# Patient Record
Sex: Female | Born: 1935 | Race: White | Hispanic: No | Marital: Married | Smoking: Never smoker
Health system: Southern US, Community
[De-identification: ages and names within clinical notes are randomized; demographics above are authoritative.]

## PROBLEM LIST (undated history)

## (undated) DIAGNOSIS — M810 Age-related osteoporosis without current pathological fracture: Secondary | ICD-10-CM

## (undated) DIAGNOSIS — S2220XA Unspecified fracture of sternum, initial encounter for closed fracture: Secondary | ICD-10-CM

## (undated) DIAGNOSIS — Z8781 Personal history of (healed) traumatic fracture: Secondary | ICD-10-CM

## (undated) DIAGNOSIS — I10 Essential (primary) hypertension: Secondary | ICD-10-CM

## (undated) DIAGNOSIS — S2239XA Fracture of one rib, unspecified side, initial encounter for closed fracture: Secondary | ICD-10-CM

## (undated) DIAGNOSIS — I2699 Other pulmonary embolism without acute cor pulmonale: Secondary | ICD-10-CM

## (undated) DIAGNOSIS — I639 Cerebral infarction, unspecified: Secondary | ICD-10-CM

## (undated) DIAGNOSIS — Z950 Presence of cardiac pacemaker: Secondary | ICD-10-CM

## (undated) DIAGNOSIS — F0390 Unspecified dementia without behavioral disturbance: Secondary | ICD-10-CM

## (undated) DIAGNOSIS — K529 Noninfective gastroenteritis and colitis, unspecified: Secondary | ICD-10-CM

## (undated) HISTORY — DX: Age-related osteoporosis without current pathological fracture: M81.0

## (undated) HISTORY — PX: APPENDECTOMY: SHX54

## (undated) HISTORY — DX: Unspecified dementia, unspecified severity, without behavioral disturbance, psychotic disturbance, mood disturbance, and anxiety: F03.90

## (undated) HISTORY — PX: ABDOMINAL HYSTERECTOMY: SHX81

## (undated) HISTORY — DX: Presence of cardiac pacemaker: Z95.0

## (undated) HISTORY — DX: Essential (primary) hypertension: I10

## (undated) HISTORY — DX: Noninfective gastroenteritis and colitis, unspecified: K52.9

## (undated) HISTORY — PX: PACEMAKER IMPLANT: EP1218

## (undated) HISTORY — DX: Cerebral infarction, unspecified: I63.9

---

## 2015-02-08 ENCOUNTER — Non-Acute Institutional Stay (SKILLED_NURSING_FACILITY): Payer: Medicare Other | Admitting: Internal Medicine

## 2015-02-08 DIAGNOSIS — R5381 Other malaise: Secondary | ICD-10-CM | POA: Diagnosis not present

## 2015-02-08 DIAGNOSIS — F0391 Unspecified dementia with behavioral disturbance: Secondary | ICD-10-CM | POA: Diagnosis not present

## 2015-02-08 DIAGNOSIS — I10 Essential (primary) hypertension: Secondary | ICD-10-CM | POA: Insufficient documentation

## 2015-02-08 DIAGNOSIS — S2241XS Multiple fractures of ribs, right side, sequela: Secondary | ICD-10-CM

## 2015-02-08 DIAGNOSIS — N3000 Acute cystitis without hematuria: Secondary | ICD-10-CM

## 2015-02-08 DIAGNOSIS — F03918 Unspecified dementia, unspecified severity, with other behavioral disturbance: Secondary | ICD-10-CM | POA: Insufficient documentation

## 2015-02-08 DIAGNOSIS — S22000S Wedge compression fracture of unspecified thoracic vertebra, sequela: Secondary | ICD-10-CM | POA: Diagnosis not present

## 2015-02-08 DIAGNOSIS — S2249XA Multiple fractures of ribs, unspecified side, initial encounter for closed fracture: Secondary | ICD-10-CM | POA: Insufficient documentation

## 2015-02-08 DIAGNOSIS — I69319 Unspecified symptoms and signs involving cognitive functions following cerebral infarction: Secondary | ICD-10-CM | POA: Diagnosis not present

## 2015-02-08 DIAGNOSIS — Z950 Presence of cardiac pacemaker: Secondary | ICD-10-CM | POA: Diagnosis not present

## 2015-02-08 DIAGNOSIS — E785 Hyperlipidemia, unspecified: Secondary | ICD-10-CM | POA: Diagnosis not present

## 2015-02-08 DIAGNOSIS — S22000A Wedge compression fracture of unspecified thoracic vertebra, initial encounter for closed fracture: Secondary | ICD-10-CM | POA: Insufficient documentation

## 2015-02-08 NOTE — Progress Notes (Signed)
Patient ID: Theresa Woodard, female   DOB: 1926/03/25, 82 y.o.   MRN: [id number]     Facility: [facility]    PCP: No primary care provider on file.  Code Status: full code  Allergies  Allergen Reactions  . Ativan [Lorazepam]   . Haldol [Haloperidol Lactate]     Chief Complaint  Patient presents with  . New Admit To SNF     HPI:  82 y.o. patient is here for short term rehabilitation post hospital admission from 01/31/15-02/07/15 with a fall. She was diagnosed to have UTI and was started on antibiotics. Culture grew e.coli and klebsiella. She sustained right rib fracture and T7 and T12 compression fracture post fall. Neurosurgery was consulted and recommended conservative management with medication and rehabilitation. She has PMH of alzhiemer's dementia, pacemaker, CVA, osteoporosis, HTN, HLD, hemorrhoids among others.   Review of Systems:  Constitutional: Negative for fever, chills, diaphoresis.  HENT: Negative for headache, congestion.   Eyes: Negative for  blurred vision, double vision and discharge.  Respiratory: Negative for cough, shortness of breath and wheezing.   Cardiovascular: Negative for chest pain, palpitations, leg swelling.  Gastrointestinal: Negative for heartburn, nausea, vomiting, abdominal pain Genitourinary: Negative for dysuria  Musculoskeletal: positive for back pain. Has high fall risk Skin: Negative for itching, rash.  Neurological: Negative for dizziness  PMH- dementia, HLD, HTN, CVA, osteoporosis  PSH- hysterectomy and appendectomy  Social history- residing at home with husband, no alcohol or tobacco use  Family history- non contributory  Medications:   Medication List       This list is accurate as of: 02/08/15 12:04 PM.  Always use your most recent med list.               aspirin 81 MG tablet  Take 81 mg by mouth daily.     atorvastatin 20 MG tablet  Commonly known as:  LIPITOR  Take 20 mg by mouth daily.     cefUROXime 500 MG tablet  Commonly known as:  CEFTIN  Take 500 mg by mouth 2 (two) times daily with a meal.     diltiazem 120 MG 24 hr capsule  Commonly known as:  DILACOR XR  Take 120 mg by mouth daily.     divalproex 125 MG DR tablet  Commonly known as:  DEPAKOTE  Take 125 mg by mouth daily.     lisinopril 20 MG tablet  Commonly known as:  PRINIVIL,ZESTRIL  Take 20 mg by mouth daily.     psyllium 0.52 g capsule  Commonly known as:  REGULOID  Take 4 capsules by mouth daily.     risperiDONE 0.5 MG tablet  Commonly known as:  RISPERDAL  Take 0.5 mg by mouth at bedtime.         Physical Exam: Filed Vitals:   02/08/15 1159  BP: 122/72  Pulse: 63  Temp: 97 F (36.1 C)  Resp: 18  SpO2: 95%    General- elderly female, frail, in no acute distress Head- normocephalic, atraumatic Nose- no maxillary or frontal sinus tenderness, no nasal discharge Throat- moist mucus membrane  Eyes- no pallor, no icterus, no discharge, normal conjunctiva, normal sclera Neck- no cervical lymphadenopathy Cardiovascular- normal s1,s2, no murmurs, poor dorsalis pedis and good radial pulses, trace leg edema Respiratory- bilateral clear to auscultation, no wheeze, no rhonchi, no crackles, no use of accessory muscles Abdomen- bowel sounds present, soft, non tender Musculoskeletal- able to move all 4 extremities, on wheelchair, generalized  weakness, scab to RLE, staisis changes to both feet and lower leg, cold to touch  Neurological- alert and oriented to person only Skin- warm and dry Psychiatry- pleasantly confused    Labs reviewed: Bun 18, cr 0.8, na 137, k 4.4, wbc 6.4, hb 12.6 Ct scan of chest- T7 and T12 compression fracture, retropulsion of posterior fracture into anterior thoracic canal has been noted, non displaced acute posterior right ninth and 10 th rib fractures, left upper lobe 3 mm pulmonary nodule  Assessment/Plan  Physical deconditioning Post fall with fractures and UTI.  Will have patient work with PT/OT as tolerated to regain strength and restore function.  Fall precautions are in place.  Thoracic compression fracture Seen by neurosurgery and surgery has been deferred. Pain management for now. Patient currently not on any pain medication. Will have her on tylenol 500 mg 2 tab tid for now and reassess. To work with therapy team  Right rib fractures Start tylenol as above for pain and reassess if pain medication does not help, fall precautions to be taken  Acute cystitis Continue and complete course of ceftin, continue florastor, maintain hydration  Dementia with behavioral disturbance Likely vascular given her PMH. Assistance with ADLs, pressure ulcer prophylaxis and fall precautions for now. Continue risperdal and depakote. Will benefit from being in a memory care unit given her advanced dementia  HTN Stable bp, continue current regimen lisinopril and diltiazem with holding parameters, check bp/HR bid for now  S/p pacemaker HR controlled. Continue diltiazem and monitor   HLD Continue lipitor for now  Old CVA Continue aspirin 81 mg daily and statin along with BP medications  Goals of care: short term rehabilitation and possible transfer to memory care unit after this   Labs/tests ordered: cbc with diff, cmp 02/11/15  Family/ staff Communication: reviewed care plan with patient and nursing supervisor    Oneal Grout, MD  [facility] [phone] (Monday-Friday 8 am - 5 pm) [phone] (afterhours)

## 2015-02-28 ENCOUNTER — Non-Acute Institutional Stay (SKILLED_NURSING_FACILITY): Payer: Medicare Other | Admitting: Family

## 2015-02-28 DIAGNOSIS — Z950 Presence of cardiac pacemaker: Secondary | ICD-10-CM | POA: Diagnosis not present

## 2015-02-28 DIAGNOSIS — R0781 Pleurodynia: Secondary | ICD-10-CM

## 2015-02-28 DIAGNOSIS — E785 Hyperlipidemia, unspecified: Secondary | ICD-10-CM

## 2015-02-28 DIAGNOSIS — I10 Essential (primary) hypertension: Secondary | ICD-10-CM | POA: Diagnosis not present

## 2015-02-28 DIAGNOSIS — I69319 Unspecified symptoms and signs involving cognitive functions following cerebral infarction: Secondary | ICD-10-CM

## 2015-02-28 DIAGNOSIS — R5381 Other malaise: Secondary | ICD-10-CM | POA: Diagnosis not present

## 2015-02-28 DIAGNOSIS — F0391 Unspecified dementia with behavioral disturbance: Secondary | ICD-10-CM

## 2015-02-28 DIAGNOSIS — F03918 Unspecified dementia, unspecified severity, with other behavioral disturbance: Secondary | ICD-10-CM

## 2015-02-28 NOTE — Progress Notes (Signed)
Patient ID: Theresa Woodard, female   DOB: [birth year], 82 y.o.   MRN: [id number]  Location:  [facility]  Provider: Oneal Grout, MD   PCP: No primary care provider on file.  Code Status: Full Code  Goals of care:  Advanced Directive information No flowsheet data found.   Allergies  Allergen Reactions  . Ativan [Lorazepam]   . Haldol [Haloperidol Lactate]     Chief Complaint  Patient presents with  . Discharge Note    HPI:  82 y.o. female seen today at [facility] for discharge to ALF. She is here for short term rehabilitation post hospital admission from 01/31/15-02/07/15 with a fall.During the course of Hosp admission she had a urine culture that grew E.coli and klebsiella was treated with antibiotics for UTI. She sustained right rib fracture and T7 and T12 compression fracture post fall. Neurosurgery was consulted and recommended conservative management with medication and rehabilitation. She has PMH of HTN, Hyperlipidemia,  alzhiemer's dementia, pacemaker, CVA, Osteoporosis, hemorrhoids among others.She is seen today in her room alert and answering questions appropriately.She states right side pain well controlled by Tylenol. She denies any acute issues. She has worked with PT/OT with much improvement .She is stable for discharge to ALF with  PT/OT for further ROM, Exercise, and muscle strengthening. She will require a pediatric standard wheel chair with cushion to allow her to maintain current level of independence with ADL's.   Review of Systems  Constitutional: Negative for fever, chills and malaise/fatigue.  HENT: Negative.   Eyes: Negative.   Respiratory: Negative.   Cardiovascular: Negative.   Gastrointestinal: Negative.   Genitourinary: Negative.   Musculoskeletal: Negative for falls.       Right side pain well controlled with Tylenol.  Skin: Negative.   Neurological: Negative.   Endo/Heme/Allergies: Negative.     Psychiatric/Behavioral: Negative.     No past medical history on file.  No past surgical history on file.    has no tobacco, alcohol, and drug history on file.  Allergies  Allergen Reactions  . Ativan [Lorazepam]   . Haldol [Haloperidol Lactate]     Pertinent  Health Maintenance Due  Topic Date Due  . DEXA SCAN  05/04/1993  . PNA vac Low Risk Adult (1 of 2 - PCV13) 05/04/1993  . INFLUENZA VACCINE  08/20/2014    Medications:   Medication List       This list is accurate as of: 02/28/15  7:59 PM.  Always use your most recent med list.               acetaminophen 500 MG tablet  Commonly known as:  TYLENOL  Take 1,000 mg by mouth every 8 (eight) hours as needed for moderate pain.     aspirin 81 MG tablet  Take 81 mg by mouth daily.     atorvastatin 20 MG tablet  Commonly known as:  LIPITOR  Take 20 mg by mouth daily.     diltiazem 120 MG 24 hr capsule  Commonly known as:  DILACOR XR  Take 120 mg by mouth daily.     divalproex 125 MG DR tablet  Commonly known as:  DEPAKOTE  Take 125 mg by mouth daily.     lisinopril 20 MG tablet  Commonly known as:  PRINIVIL,ZESTRIL  Take 20 mg by mouth daily.     Melatonin 3 MG Tabs  Take 1 tablet by mouth.     psyllium 0.52 g capsule  Commonly known as:  REGULOID  Take 4 capsules by mouth daily.     risperiDONE 0.5 MG tablet  Commonly known as:  RISPERDAL  Take 0.25 mg by mouth at bedtime.         Filed Vitals:   02/28/15 1948  BP: 146/62  Pulse: 61  Temp: 97.5 F (36.4 C)  Resp: 18  Weight: 98 lb 6.4 oz (44.634 kg)  SpO2: 94%   There is no height on file to calculate BMI. Physical Exam  Constitutional: She appears well-nourished.  Pleasant Elderly in no acute distress   HENT:  Head: Normocephalic.  Right Ear: External ear normal.  Left Ear: External ear normal.  Mouth/Throat: Oropharynx is clear and moist.  Eyes: EOM are normal. Pupils are equal, round, and reactive to light. Right eye exhibits  no discharge. Left eye exhibits no discharge. No scleral icterus.  Neck: Neck supple. No JVD present. No tracheal deviation present. No thyromegaly present.  Cardiovascular: Exam reveals no gallop and no friction rub.   No murmur heard. Pace Maker in situ   Pulmonary/Chest: Effort normal and breath sounds normal. She has no wheezes. She has no rales.  Abdominal: Soft. Bowel sounds are normal. She exhibits no distension and no mass. There is no tenderness. There is no rebound and no guarding.  Musculoskeletal: Normal range of motion. She exhibits no edema or tenderness.  Lymphadenopathy:    She has no cervical adenopathy.  Neurological: She is alert.  Skin: Skin is warm and dry. No rash noted. No erythema. No pallor.  Psychiatric: She has a normal mood and affect.    Labs reviewed: Basic Metabolic Panel: No results for input(s): NA, K, CL, CO2, GLUCOSE, BUN, CREATININE, CALCIUM, MG, PHOS in the last 8760 hours. Liver Function Tests: No results for input(s): AST, ALT, ALKPHOS, BILITOT, PROT, ALBUMIN in the last 8760 hours. No results for input(s): LIPASE, AMYLASE in the last 8760 hours. No results for input(s): AMMONIA in the last 8760 hours. CBC: No results for input(s): WBC, NEUTROABS, HGB, HCT, MCV, PLT in the last 8760 hours. Cardiac Enzymes: No results for input(s): CKTOTAL, CKMB, CKMBINDEX, TROPONINI in the last 8760 hours. BNP: Invalid input(s): POCBNP CBG: No results for input(s): GLUCAP in the last 8760 hours.  Procedures and Imaging Studies During Stay: No results found.  Assessment/Plan:   1. Essential hypertension, benign B/p stable continue on Lisinopril, Diltiazem. ALF provider to monitor BMP  2. Dementia with behavioral disturbance No new behavioral issues. Continue on Depakote and low dose Risperdal. Assist with ADL's. Pressure ulcer and Fall precautions.   3. Hyperlipidemia Continue on Lipitor. Monitor for muscle weakness. Monitor lipid panel and adjust Statin  if indicated.    4. Cardiac pacemaker in situ Asymptomatic. Continue to follow up with Cardiologist.   5. Physical deconditioning Has improved with PT/OT will discharge to ALF with PT/OT for ROM, exercise and Muscle strengthening. Ordering a pediatric standard wheelchair with Cushion to enable maintainance of current level of independence with ADL's.   6. CVA, old, cognitive deficits Continue on ASA and statin. Monitor B/p.   7.Right Rib pain  She is status post Hosp. admission after fall.She sustained right rib fracture and T7 and T12 compression fracture.. Neurosurgery was consulted and recommended conservative management with medication and rehabilitation.Pain well controlled with Tylenol PRN.     Patient is being discharged with the following home health services:    PT/OT for further ROM, Exercise, and muscle strengthening.   Patient is being discharged  with the following durable medical equipment:    Pediatric standard wheel chair with cushion to allow her to maintain current level of independence with ADL's.     Patient has been advised to f/u with their PCP in 1-2 weeks to bring them up to date on their rehab stay.  Social services at facility was responsible for arranging this appointment.  Pt was provided with a 30 day supply of prescriptions for medications and refills must be obtained from their PCP.  For controlled substances, a more limited supply may be provided adequate until PCP appointment only.  Future labs/tests needed: None

## 2015-09-12 ENCOUNTER — Other Ambulatory Visit: Payer: Self-pay | Admitting: Vascular Surgery

## 2015-09-12 DIAGNOSIS — I872 Venous insufficiency (chronic) (peripheral): Secondary | ICD-10-CM

## 2015-09-27 ENCOUNTER — Encounter: Payer: Self-pay | Admitting: Vascular Surgery

## 2015-10-04 ENCOUNTER — Encounter (HOSPITAL_COMMUNITY): Payer: Self-pay

## 2015-10-04 ENCOUNTER — Encounter: Payer: Self-pay | Admitting: Vascular Surgery

## 2015-12-22 LAB — BASIC METABOLIC PANEL
BUN: 24 mg/dL — AB (ref 4–21)
Creatinine: 0.9 mg/dL (ref 0.5–1.1)
POTASSIUM: 4 mmol/L (ref 3.4–5.3)
SODIUM: 139 mmol/L (ref 137–147)

## 2015-12-22 LAB — CBC AND DIFFERENTIAL: WBC: 7000 10*3/mL

## 2015-12-26 ENCOUNTER — Non-Acute Institutional Stay (SKILLED_NURSING_FACILITY): Payer: Medicare Other | Admitting: Internal Medicine

## 2015-12-26 ENCOUNTER — Encounter: Payer: Self-pay | Admitting: Internal Medicine

## 2015-12-26 DIAGNOSIS — F01518 Vascular dementia, unspecified severity, with other behavioral disturbance: Secondary | ICD-10-CM

## 2015-12-26 DIAGNOSIS — Z950 Presence of cardiac pacemaker: Secondary | ICD-10-CM | POA: Diagnosis not present

## 2015-12-26 DIAGNOSIS — R5381 Other malaise: Secondary | ICD-10-CM

## 2015-12-26 DIAGNOSIS — E785 Hyperlipidemia, unspecified: Secondary | ICD-10-CM

## 2015-12-26 DIAGNOSIS — F0151 Vascular dementia with behavioral disturbance: Secondary | ICD-10-CM | POA: Diagnosis not present

## 2015-12-26 DIAGNOSIS — Z8673 Personal history of transient ischemic attack (TIA), and cerebral infarction without residual deficits: Secondary | ICD-10-CM

## 2015-12-26 DIAGNOSIS — I739 Peripheral vascular disease, unspecified: Secondary | ICD-10-CM

## 2015-12-26 DIAGNOSIS — L03115 Cellulitis of right lower limb: Secondary | ICD-10-CM

## 2015-12-26 DIAGNOSIS — I10 Essential (primary) hypertension: Secondary | ICD-10-CM

## 2015-12-26 DIAGNOSIS — L03116 Cellulitis of left lower limb: Secondary | ICD-10-CM | POA: Diagnosis not present

## 2015-12-26 NOTE — Progress Notes (Signed)
[facility]  PCP: No primary care provider on file.   Code Status: Full Code  Goals of care: Advanced Directive information No flowsheet data found.     Extended Emergency Contact Information Primary Emergency Contact: Trachtenberg,Jim Address: [address]          [place], [place] [address] [place] Home Phone: [phone] Mobile Phone: [phone] Relation: Spouse Secondary Emergency Contact: Cyndia DiverVaden,Harold  [place] Relation: Friend   Allergies  Allergen Reactions  . Ativan [Lorazepam]   . Haldol [Haloperidol Lactate]     Chief Complaint  Patient presents with  . New Admit To SNF    New Admissin Visit     HPI:  Patient is a 82 y.o. female seen today for short term rehabilitation post hospital admission from 12/21/15-12/25/15 with bilateral lower extremity edema from cellulitis and venous stasis. She was treated with diuretic and antibiotic she has advanced dementia. She is seen in her room today.   Review of Systems:  Constitutional: Negative for fever, chills, diaphoresis.  HENT: Negative for headache, congestion, nasal discharge, sore throat, difficulty swallowing.   Eyes: Negative for double vision and discharge.  Respiratory: Negative for cough, shortness of breath and wheezing.   Cardiovascular: Negative for chest pain, palpitations.  Gastrointestinal: Negative for heartburn, nausea, vomiting, abdominal pain. Last bowel movement was yesterday. Genitourinary: Negative for dysuria.  Musculoskeletal: Negative for back pain, fall in the facility.  Skin: Negative for itching, rash.  Neurological: Negative for dizziness. Psychiatric/Behavioral: Negative for depression.    Past Medical History:  Diagnosis Date  . Chronic diarrhea   . Dementia   . Hypertension   . Osteoporosis   . Pacemaker   . Stroke [facility](HCC)    Past Surgical History:  Procedure Laterality Date  . ABDOMINAL HYSTERECTOMY    . APPENDECTOMY     Social  History:   reports that she has never smoked. She has never used smokeless tobacco. She reports that she does not drink alcohol or use drugs.  No family history on file.  Medications:   Medication List       Accurate as of 12/26/15 12:50 PM. Always use your most recent med list.          acetaminophen 500 MG tablet Commonly known as:  TYLENOL Take 500 mg by mouth every 6 (six) hours as needed for moderate pain.   aspirin 81 MG tablet Take 81 mg by mouth daily.   atorvastatin 20 MG tablet Commonly known as:  LIPITOR Take 20 mg by mouth daily.   cefUROXime 500 MG tablet Commonly known as:  CEFTIN Take 500 mg by mouth 2 (two) times daily. Stop date 12/28/15   diltiazem 120 MG 24 hr capsule Commonly known as:  DILACOR XR Take 120 mg by mouth daily.   divalproex 125 MG DR tablet Commonly known as:  DEPAKOTE Take 125 mg by mouth daily.   lisinopril 20 MG tablet Commonly known as:  PRINIVIL,ZESTRIL Take 20 mg by mouth daily.   loperamide 2 MG capsule Commonly known as:  IMODIUM Take 2 mg by mouth 2 (two) times daily as needed for diarrhea or loose stools.   Melatonin 3 MG Tabs Take 1 tablet by mouth.   METAMUCIL PO Take 4 capsules by mouth daily.       Immunizations:  There is no immunization history on file for this patient.   Physical Exam: Vitals:   12/26/15 1235  BP: (!) 142/84  Pulse: 69  Resp: 20  Temp: 98.4 F (36.9 C)  TempSrc: Oral  SpO2: 92%   There is no height or weight on file to calculate BMI.  General- elderly female, well built, in no acute distress Head- normocephalic, atraumatic Nose- no maxillary or frontal sinus tenderness, no nasal discharge Throat- moist mucus membrane Eyes- PERRLA, EOMI, no pallor, no icterus, no discharge, normal conjunctiva, normal sclera Neck- no cervical lymphadenopathy Cardiovascular- normal s1,s2, no murmur Respiratory- bilateral clear to auscultation, no wheeze, no rhonchi, no crackles, no use of  accessory muscles Abdomen- bowel sounds present, soft, non tender Musculoskeletal- able to move all 4 extremities, generalized weakness, on wheelchair, 1+ leg edema Neurological- alert and oriented to self only Skin- warm and dry, chronic venous stasis changes to both lower legs with dry flaky skin and some scabbed wound.  Psychiatry- normal mood and affect    Labs reviewed: Basic Metabolic Panel:  Recent Labs  96/29/5210/04/05  NA 139  K 4.0  BUN 24*  CREATININE 0.9   CBC:  Recent Labs  12/22/15  WBC 7,000.0     Assessment/Plan  Physical deconditioning From cellulitis and deconditioning. Will have patient work with PT/OT as tolerated to regain strength and restore function.  Fall precautions are in place.  Lower extremity cellulitis Improved. Continue and complete ceftin 500 mg bid on 12/28/15. provide skin care  PVD With venous stasis. Will need to provide skin care and foot care, keep skin moisturized and apply compression stocking to help with edema. keep legs elevated at rest. Currently on aspirin 81 mg daily.   Vascular Dementia with behavioral disturbance To provide assistance with ADLs, pressure ulcer prophylaxis and fall precautions for now. Continue depakote. Will benefit from being in a memory care unit post rehab given her advanced dementia  Cardiac pacemaker in situ HR controlled. Continue diltiazem and monitor   History of CVA Continue aspirin 81 mg daily and statin.  HLD Continue atorvastatin  HTN Stable bp, continue current regimen lisinopril and diltiazem with holding parameters, check bp/HR bid for now   Goals of care: short term rehabilitation and possible transfer to memory care unit post rehab  Labs/tests ordered: cbc, bmp 12/30/15   Family/ staff Communication: reviewed care plan with patient and nursing supervisor    Oneal GroutMAHIMA Kinzey Sheriff, MD Internal Medicine [facility] [address] [place], [place]  [id number] Cell Phone (Monday-Friday 8 am - 5 pm): [phone] On Call: [phone] and follow prompts after 5 pm and on weekends Office Phone: [phone] Office Fax: [phone]

## 2016-01-02 ENCOUNTER — Non-Acute Institutional Stay (SKILLED_NURSING_FACILITY): Payer: Medicare Other | Admitting: Family

## 2016-01-02 DIAGNOSIS — W19XXXA Unspecified fall, initial encounter: Secondary | ICD-10-CM

## 2016-01-02 DIAGNOSIS — R2681 Unsteadiness on feet: Secondary | ICD-10-CM | POA: Diagnosis not present

## 2016-01-02 DIAGNOSIS — F0391 Unspecified dementia with behavioral disturbance: Secondary | ICD-10-CM

## 2016-01-02 DIAGNOSIS — Y92129 Unspecified place in nursing home as the place of occurrence of the external cause: Secondary | ICD-10-CM

## 2016-01-02 NOTE — Progress Notes (Signed)
Location:  [facility] Room Number: 907 Place of Service:  SNF [phone]) Provider:  Richarda Bladeinah Ebon Ketchum FNP-C    Extended Emergency Contact Information Primary Emergency Contact: Heeg,Jim Address: [address]          [place], [place] [address] [place] Home Phone: [phone] Mobile Phone: [phone] Relation: Spouse Secondary Emergency Contact: Cyndia DiverVaden,Harold  [place] Relation: Friend  Code Status: Full Code  Goals of care: Advanced Directive information No flowsheet data found.   Chief Complaint  Patient presents with  . Acute Visit    follow up fall     HPI:  Pt is a 82 y.o. female seen today at [facility]  for an acute visit for fall episode. She has a significant medical history of HTN, CVA, Dementia with behavioral disturbances among other conditions. She is seen in her room today per facility Nurse request. Facility Nurse reports patient was found sitting on the floor alongside of the bed in her room. No acute injuries noted. Patient has been up on Geri-chair this visit no signs of pain reported. She is pleasantly confused unable to provide HPI and ROS information.    Past Medical History:  Diagnosis Date  . Chronic diarrhea   . Dementia   . Hypertension   . Osteoporosis   . Pacemaker   . Stroke [facility](HCC)    Past Surgical History:  Procedure Laterality Date  . ABDOMINAL HYSTERECTOMY    . APPENDECTOMY      Allergies  Allergen Reactions  . Ativan [Lorazepam]   . Haldol [Haloperidol Lactate]     Allergies as of 01/02/2016      Reactions   Ativan [lorazepam]    Haldol [haloperidol Lactate]       Medication List       Accurate as of 01/02/16  6:34 PM. Always use your most recent med list.          acetaminophen 500 MG tablet Commonly known as:  TYLENOL Take 500 mg by mouth every 6 (six) hours as needed for moderate pain.   aspirin 81 MG tablet Take 81 mg by mouth daily.     atorvastatin 20 MG tablet Commonly known as:  LIPITOR Take 20 mg by mouth daily.   diltiazem 120 MG 24 hr capsule Commonly known as:  DILACOR XR Take 120 mg by mouth daily.   divalproex 125 MG DR tablet Commonly known as:  DEPAKOTE Take 125 mg by mouth daily.   lisinopril 20 MG tablet Commonly known as:  PRINIVIL,ZESTRIL Take 20 mg by mouth daily.   loperamide 2 MG capsule Commonly known as:  IMODIUM Take 2 mg by mouth 2 (two) times daily as needed for diarrhea or loose stools.   Melatonin 3 MG Tabs Take 1 tablet by mouth.   METAMUCIL PO Take 4 capsules by mouth daily.   multivitamin with minerals Tabs tablet Take 1 tablet by mouth daily.   UNABLE TO FIND Med Name: Prostat  30 mls by mouth twice daily       Review of Systems  Unable to perform ROS: Dementia     There is no immunization history on file for this patient. Pertinent  Health Maintenance Due  Topic Date Due  . DEXA SCAN  05/04/1993  . PNA vac Low Risk Adult (1 of 2 - PCV13) 05/04/1993  . INFLUENZA VACCINE  08/20/2015      Vitals:   01/02/16 1300  BP: 135/75  Pulse: 72  Resp: 20  Temp: 98.2 F (36.8 C)  SpO2: 98%  Weight: 105 lb 6.4 oz (47.8 kg)  Height: 4\' 10"  (1.473 m)   Body mass index is 22.03 kg/m. Physical Exam  Constitutional: She appears well-developed and well-nourished. No distress.  Pleasantly confused at baseline.  HENT:  Head: Normocephalic.  Mouth/Throat: Oropharynx is clear and moist. No oropharyngeal exudate.  Eyes: Conjunctivae and EOM are normal. Pupils are equal, round, and reactive to light. Right eye exhibits no discharge. Left eye exhibits no discharge. No scleral icterus.  Neck: Neck supple. No JVD present. No thyromegaly present.  Cardiovascular: Normal rate, regular rhythm, normal heart sounds and intact distal pulses.  Exam reveals no gallop and no friction rub.   No murmur heard. Pulmonary/Chest: Effort normal and breath sounds normal. No respiratory  distress. She has no wheezes. She has no rales. She exhibits no tenderness.  Abdominal: Soft. Bowel sounds are normal. She exhibits no distension and no mass. There is no tenderness. There is no rebound and no guarding.  Musculoskeletal: She exhibits no tenderness or deformity.  Moves x 4 extremities. Unsteady gait   Lymphadenopathy:    She has no cervical adenopathy.  Neurological: She is alert.  Pleasantly confused at baseline.  Skin: Skin is warm and dry. No rash noted. No erythema. No pallor.  Psychiatric: She has a normal mood and affect.    Labs reviewed:  Recent Labs  12/22/15  NA 139  K 4.0  BUN 24*  CREATININE 0.9    Recent Labs  12/22/15  WBC 7,000.0   Assessment/Plan 1. Unsteady gait Fall and safety precautions. Continue PT/OT for ROM, exercise, and muscle strengthening. Continue to monitor.   2. Dementia with behavioral disturbance, unspecified dementia type Pleasantly confused at baseline.continue to assist with ADL's. Encourage oral and fluid intake.   3. Fall at nursing home, initial encounter Sustained unwitnessed fall in the room. No acute injuries noted. No signs of pain. Will obtain Urine specimen for U/A and C/S rule out UTI.      Family/ staff Communication: Reviewed plan of care with Facility Nurse supervisor.   Labs/tests ordered:  Urine specimen for U/A and C/S rule out UTI.

## 2016-01-10 ENCOUNTER — Non-Acute Institutional Stay (SKILLED_NURSING_FACILITY): Payer: Medicare Other | Admitting: Family

## 2016-01-10 DIAGNOSIS — L853 Xerosis cutis: Secondary | ICD-10-CM | POA: Diagnosis not present

## 2016-01-10 DIAGNOSIS — E782 Mixed hyperlipidemia: Secondary | ICD-10-CM

## 2016-01-10 DIAGNOSIS — I69319 Unspecified symptoms and signs involving cognitive functions following cerebral infarction: Secondary | ICD-10-CM

## 2016-01-10 DIAGNOSIS — R2681 Unsteadiness on feet: Secondary | ICD-10-CM | POA: Diagnosis not present

## 2016-01-10 DIAGNOSIS — I10 Essential (primary) hypertension: Secondary | ICD-10-CM | POA: Diagnosis not present

## 2016-01-10 DIAGNOSIS — G4700 Insomnia, unspecified: Secondary | ICD-10-CM

## 2016-01-10 DIAGNOSIS — F0391 Unspecified dementia with behavioral disturbance: Secondary | ICD-10-CM | POA: Diagnosis not present

## 2016-01-10 DIAGNOSIS — K5901 Slow transit constipation: Secondary | ICD-10-CM

## 2016-01-10 MED ORDER — AQUAPHOR EX OINT: TOPICAL_OINTMENT | CUTANEOUS | 0 refills | Status: DC | PRN

## 2016-01-10 NOTE — Progress Notes (Addendum)
Location:  [facility] Room Number: 907 Place of Service:  SNF [phone] Provider:  Richarda Bladeinah Ngetich FNP-C   Extended Emergency Contact Information Primary Emergency Contact: Havey,Jim Address: [address]          [place], [place] [address] [place] Home Phone: [phone] Mobile Phone: [phone] Relation: Spouse Secondary Emergency Contact: Cyndia DiverVaden,Harold  [place] Relation: Friend  Code Status:  Full Code  Goals of care: Advanced Directive information No flowsheet data found.   Chief Complaint  Patient presents with  . Discharge Note    Discharge from SNF     HPI:  Pt is a 82 y.o. female seen today at [facility] for medical management of chronic diseases. She was here for short term rehabilitation post hospital admission from 12/21/15-12/25/15 with bilateral lower extremity edema from cellulitis and venous stasis. She was treated with diuretic and antibiotics. She has a medical history of HTN, CVA, osteoporosis, Pacemaker, advanced dementia. She is seen today in her room. Bilateral leg cellulitis resolved.She denies any acute issues this visit though history limited due to her advance dementia. She has worked well with PT/OT now stable for discharge back to [facility]. She will be discharge with HH: PT/OT to continue with ROM, exercise, gait stability and muscle strengthening.She does not require any DME.Home health services will be arranged by facility social worker prior to discharge. Prescription medication will be written x 1 month then patient to follow up with PCP in 1-2 weeks. Facility staff report no new concerns.       Past Medical History:  Diagnosis Date  . Chronic diarrhea   . Dementia   . Hypertension   . Osteoporosis   . Pacemaker   . Stroke [facility](HCC)    Past Surgical History:  Procedure Laterality Date  . ABDOMINAL HYSTERECTOMY    . APPENDECTOMY      Allergies  Allergen Reactions    . Ativan [Lorazepam]   . Haldol [Haloperidol Lactate]     Allergies as of 01/10/2016      Reactions   Ativan [lorazepam]    Haldol [haloperidol Lactate]       Medication List       Accurate as of 01/10/16  4:34 PM. Always use your most recent med list.          acetaminophen 500 MG tablet Commonly known as:  TYLENOL Take 500 mg by mouth every 6 (six) hours as needed for moderate pain.   aspirin 81 MG tablet Take 81 mg by mouth daily.   atorvastatin 20 MG tablet Commonly known as:  LIPITOR Take 20 mg by mouth daily.   diltiazem 120 MG 24 hr capsule Commonly known as:  DILACOR XR Take 120 mg by mouth daily.   divalproex 125 MG DR tablet Commonly known as:  DEPAKOTE Take 125 mg by mouth daily.   lisinopril 20 MG tablet Commonly known as:  PRINIVIL,ZESTRIL Take 20 mg by mouth daily.   loperamide 2 MG capsule Commonly known as:  IMODIUM Take 2 mg by mouth 2 (two) times daily as needed for diarrhea or loose stools.   Melatonin 3 MG Tabs Take 1 tablet by mouth.   METAMUCIL PO Take 4 capsules by mouth daily.   multivitamin with minerals Tabs tablet Take 1 tablet by mouth daily.   UNABLE TO FIND Med Name: Prostat  30 mls by mouth twice daily  Review of Systems  Unable to perform ROS: Dementia     There is no immunization history on file for this patient. Pertinent  Health Maintenance Due  Topic Date Due  . DEXA SCAN  05/04/1993  . PNA vac Low Risk Adult (1 of 2 - PCV13) 05/04/1993  . INFLUENZA VACCINE  08/20/2015   No flowsheet data found. Functional Status Survey:    Vitals:   01/10/16 1200  BP: 128/86  Pulse: 65  Resp: 20  Temp: 97.9 F (36.6 C)  SpO2: 98%  Weight: 104 lb 3.2 oz (47.3 kg)  Height: 4\' 10"  (1.473 m)   Body mass index is 21.78 kg/m. Physical Exam  Constitutional: She appears well-developed and well-nourished. No distress.  Elderly  HENT:  Head: Normocephalic.  Mouth/Throat: Oropharynx is clear and moist. No  oropharyngeal exudate.  Eyes: Conjunctivae and EOM are normal. Pupils are equal, round, and reactive to light. Right eye exhibits no discharge. Left eye exhibits no discharge. No scleral icterus.  Neck: Neck supple. No JVD present. No thyromegaly present.  Cardiovascular: Normal rate, regular rhythm, normal heart sounds and intact distal pulses.  Exam reveals no gallop and no friction rub.   No murmur heard. Pulmonary/Chest: Effort normal and breath sounds normal. No respiratory distress. She has no wheezes. She has no rales. She exhibits no tenderness.  Abdominal: Soft. Bowel sounds are normal. She exhibits no distension and no mass. There is no tenderness. There is no rebound and no guarding.  LBM 01/10/2016   Genitourinary:  Genitourinary Comments: Incontinent for both bowel and bladder.   Musculoskeletal: She exhibits no tenderness or deformity.  Moves x 4 extremities. Unsteady gait   Lymphadenopathy:    She has no cervical adenopathy.  Neurological: She is alert.  Pleasantly confused at baseline.  Skin: Skin is warm and dry. No rash noted. No erythema. No pallor.  bilateral lower extremities skin dryness noted.   Psychiatric: She has a normal mood and affect.    Labs reviewed:  Recent Labs  12/22/15  NA 139  K 4.0  BUN 24*  CREATININE 0.9    Recent Labs  12/22/15  WBC 7,000.0   Assessment/Plan 1. Essential hypertension, benign B/p stable. continue on Lisinopril, Diltiazem. ALF provider to monitor CBC and BMP  2. CVA, old, cognitive deficits Continue on ASA. Fall and safety precautions.   3. Mixed hyperlipidemia Continue on Lipitor. lipid panel with PCP.    4. Slow transit constipation Current regimen effective.   5. Unsteady gait Has improved with PT/OT will discharge to ALF with PT/OT for ROM, exercise and Muscle strengthening. No DME required. Fall and safety precautions.   6. Insomnia, unspecified type Continue on melatonin.   7. Dementia with behavioral  disturbance, unspecified dementia type No new behavioral issues. Continue to assist with ADL's. Encourage hydration and oral intake. Skin care.  8. Dry skin dermatitis  Apply Aquaphor ointment twice daily to feet/leg x 14 days then discontinue.   Family/ staff Communication: Reviewed plan of care with facility Nurse supervisor.   Labs/tests ordered:  CBC, BMP in 1-2 weeks with PCP

## 2016-01-10 NOTE — Addendum Note (Signed)
Addended byRicharda Blade: NGETICH, DINAH C on: 01/10/2016 05:12 PM   Modules accepted: Orders

## 2016-01-22 DIAGNOSIS — G309 Alzheimer's disease, unspecified: Secondary | ICD-10-CM | POA: Diagnosis not present

## 2016-01-22 DIAGNOSIS — E782 Mixed hyperlipidemia: Secondary | ICD-10-CM | POA: Diagnosis not present

## 2016-01-22 DIAGNOSIS — R2681 Unsteadiness on feet: Secondary | ICD-10-CM | POA: Diagnosis not present

## 2016-01-22 DIAGNOSIS — I69318 Other symptoms and signs involving cognitive functions following cerebral infarction: Secondary | ICD-10-CM | POA: Diagnosis not present

## 2016-01-22 DIAGNOSIS — I10 Essential (primary) hypertension: Secondary | ICD-10-CM | POA: Diagnosis not present

## 2016-01-22 DIAGNOSIS — Z950 Presence of cardiac pacemaker: Secondary | ICD-10-CM | POA: Diagnosis not present

## 2016-01-22 DIAGNOSIS — F0280 Dementia in other diseases classified elsewhere without behavioral disturbance: Secondary | ICD-10-CM | POA: Diagnosis not present

## 2016-01-22 DIAGNOSIS — Z7982 Long term (current) use of aspirin: Secondary | ICD-10-CM | POA: Diagnosis not present

## 2016-01-22 DIAGNOSIS — I872 Venous insufficiency (chronic) (peripheral): Secondary | ICD-10-CM | POA: Diagnosis not present

## 2016-01-28 DIAGNOSIS — Z299 Encounter for prophylactic measures, unspecified: Secondary | ICD-10-CM | POA: Diagnosis not present

## 2016-01-28 DIAGNOSIS — L97921 Non-pressure chronic ulcer of unspecified part of left lower leg limited to breakdown of skin: Secondary | ICD-10-CM | POA: Diagnosis not present

## 2016-01-28 DIAGNOSIS — R609 Edema, unspecified: Secondary | ICD-10-CM | POA: Diagnosis not present

## 2016-01-28 DIAGNOSIS — Z789 Other specified health status: Secondary | ICD-10-CM | POA: Diagnosis not present

## 2016-01-28 DIAGNOSIS — F0390 Unspecified dementia without behavioral disturbance: Secondary | ICD-10-CM | POA: Diagnosis not present

## 2016-01-28 DIAGNOSIS — Z6827 Body mass index (BMI) 27.0-27.9, adult: Secondary | ICD-10-CM | POA: Diagnosis not present

## 2016-01-28 DIAGNOSIS — L0390 Cellulitis, unspecified: Secondary | ICD-10-CM | POA: Diagnosis not present

## 2016-01-29 DIAGNOSIS — I10 Essential (primary) hypertension: Secondary | ICD-10-CM | POA: Diagnosis not present

## 2016-01-29 DIAGNOSIS — F0280 Dementia in other diseases classified elsewhere without behavioral disturbance: Secondary | ICD-10-CM | POA: Diagnosis not present

## 2016-01-29 DIAGNOSIS — R2681 Unsteadiness on feet: Secondary | ICD-10-CM | POA: Diagnosis not present

## 2016-01-29 DIAGNOSIS — Z7982 Long term (current) use of aspirin: Secondary | ICD-10-CM | POA: Diagnosis not present

## 2016-01-29 DIAGNOSIS — Z950 Presence of cardiac pacemaker: Secondary | ICD-10-CM | POA: Diagnosis not present

## 2016-01-29 DIAGNOSIS — I69318 Other symptoms and signs involving cognitive functions following cerebral infarction: Secondary | ICD-10-CM | POA: Diagnosis not present

## 2016-01-29 DIAGNOSIS — G309 Alzheimer's disease, unspecified: Secondary | ICD-10-CM | POA: Diagnosis not present

## 2016-01-29 DIAGNOSIS — E782 Mixed hyperlipidemia: Secondary | ICD-10-CM | POA: Diagnosis not present

## 2016-01-29 DIAGNOSIS — I872 Venous insufficiency (chronic) (peripheral): Secondary | ICD-10-CM | POA: Diagnosis not present

## 2016-02-01 DIAGNOSIS — I69318 Other symptoms and signs involving cognitive functions following cerebral infarction: Secondary | ICD-10-CM | POA: Diagnosis not present

## 2016-02-01 DIAGNOSIS — Z7982 Long term (current) use of aspirin: Secondary | ICD-10-CM | POA: Diagnosis not present

## 2016-02-01 DIAGNOSIS — E782 Mixed hyperlipidemia: Secondary | ICD-10-CM | POA: Diagnosis not present

## 2016-02-01 DIAGNOSIS — G309 Alzheimer's disease, unspecified: Secondary | ICD-10-CM | POA: Diagnosis not present

## 2016-02-01 DIAGNOSIS — R2681 Unsteadiness on feet: Secondary | ICD-10-CM | POA: Diagnosis not present

## 2016-02-01 DIAGNOSIS — I872 Venous insufficiency (chronic) (peripheral): Secondary | ICD-10-CM | POA: Diagnosis not present

## 2016-02-01 DIAGNOSIS — I10 Essential (primary) hypertension: Secondary | ICD-10-CM | POA: Diagnosis not present

## 2016-02-01 DIAGNOSIS — Z950 Presence of cardiac pacemaker: Secondary | ICD-10-CM | POA: Diagnosis not present

## 2016-02-01 DIAGNOSIS — F0280 Dementia in other diseases classified elsewhere without behavioral disturbance: Secondary | ICD-10-CM | POA: Diagnosis not present

## 2016-02-08 DIAGNOSIS — Z950 Presence of cardiac pacemaker: Secondary | ICD-10-CM | POA: Diagnosis not present

## 2016-02-08 DIAGNOSIS — F0280 Dementia in other diseases classified elsewhere without behavioral disturbance: Secondary | ICD-10-CM | POA: Diagnosis not present

## 2016-02-08 DIAGNOSIS — R2681 Unsteadiness on feet: Secondary | ICD-10-CM | POA: Diagnosis not present

## 2016-02-08 DIAGNOSIS — G309 Alzheimer's disease, unspecified: Secondary | ICD-10-CM | POA: Diagnosis not present

## 2016-02-08 DIAGNOSIS — E782 Mixed hyperlipidemia: Secondary | ICD-10-CM | POA: Diagnosis not present

## 2016-02-08 DIAGNOSIS — I872 Venous insufficiency (chronic) (peripheral): Secondary | ICD-10-CM | POA: Diagnosis not present

## 2016-02-08 DIAGNOSIS — I10 Essential (primary) hypertension: Secondary | ICD-10-CM | POA: Diagnosis not present

## 2016-02-08 DIAGNOSIS — I69318 Other symptoms and signs involving cognitive functions following cerebral infarction: Secondary | ICD-10-CM | POA: Diagnosis not present

## 2016-02-08 DIAGNOSIS — Z7982 Long term (current) use of aspirin: Secondary | ICD-10-CM | POA: Diagnosis not present

## 2016-02-18 DIAGNOSIS — G309 Alzheimer's disease, unspecified: Secondary | ICD-10-CM | POA: Diagnosis not present

## 2016-02-18 DIAGNOSIS — Z7982 Long term (current) use of aspirin: Secondary | ICD-10-CM | POA: Diagnosis not present

## 2016-02-18 DIAGNOSIS — Z950 Presence of cardiac pacemaker: Secondary | ICD-10-CM | POA: Diagnosis not present

## 2016-02-18 DIAGNOSIS — R2681 Unsteadiness on feet: Secondary | ICD-10-CM | POA: Diagnosis not present

## 2016-02-18 DIAGNOSIS — E782 Mixed hyperlipidemia: Secondary | ICD-10-CM | POA: Diagnosis not present

## 2016-02-18 DIAGNOSIS — I69318 Other symptoms and signs involving cognitive functions following cerebral infarction: Secondary | ICD-10-CM | POA: Diagnosis not present

## 2016-02-18 DIAGNOSIS — F0280 Dementia in other diseases classified elsewhere without behavioral disturbance: Secondary | ICD-10-CM | POA: Diagnosis not present

## 2016-02-18 DIAGNOSIS — I10 Essential (primary) hypertension: Secondary | ICD-10-CM | POA: Diagnosis not present

## 2016-02-18 DIAGNOSIS — I872 Venous insufficiency (chronic) (peripheral): Secondary | ICD-10-CM | POA: Diagnosis not present

## 2016-02-25 DIAGNOSIS — G309 Alzheimer's disease, unspecified: Secondary | ICD-10-CM | POA: Diagnosis not present

## 2016-02-25 DIAGNOSIS — Z7982 Long term (current) use of aspirin: Secondary | ICD-10-CM | POA: Diagnosis not present

## 2016-02-25 DIAGNOSIS — I69318 Other symptoms and signs involving cognitive functions following cerebral infarction: Secondary | ICD-10-CM | POA: Diagnosis not present

## 2016-02-25 DIAGNOSIS — F0280 Dementia in other diseases classified elsewhere without behavioral disturbance: Secondary | ICD-10-CM | POA: Diagnosis not present

## 2016-02-25 DIAGNOSIS — I872 Venous insufficiency (chronic) (peripheral): Secondary | ICD-10-CM | POA: Diagnosis not present

## 2016-02-25 DIAGNOSIS — R2681 Unsteadiness on feet: Secondary | ICD-10-CM | POA: Diagnosis not present

## 2016-02-25 DIAGNOSIS — E782 Mixed hyperlipidemia: Secondary | ICD-10-CM | POA: Diagnosis not present

## 2016-02-25 DIAGNOSIS — Z950 Presence of cardiac pacemaker: Secondary | ICD-10-CM | POA: Diagnosis not present

## 2016-02-25 DIAGNOSIS — I10 Essential (primary) hypertension: Secondary | ICD-10-CM | POA: Diagnosis not present

## 2016-02-26 DIAGNOSIS — M79674 Pain in right toe(s): Secondary | ICD-10-CM | POA: Diagnosis not present

## 2016-02-26 DIAGNOSIS — B351 Tinea unguium: Secondary | ICD-10-CM | POA: Diagnosis not present

## 2016-02-26 DIAGNOSIS — M79675 Pain in left toe(s): Secondary | ICD-10-CM | POA: Diagnosis not present

## 2016-02-27 DIAGNOSIS — N390 Urinary tract infection, site not specified: Secondary | ICD-10-CM | POA: Diagnosis not present

## 2016-03-03 DIAGNOSIS — G309 Alzheimer's disease, unspecified: Secondary | ICD-10-CM | POA: Diagnosis not present

## 2016-03-03 DIAGNOSIS — E782 Mixed hyperlipidemia: Secondary | ICD-10-CM | POA: Diagnosis not present

## 2016-03-03 DIAGNOSIS — Z950 Presence of cardiac pacemaker: Secondary | ICD-10-CM | POA: Diagnosis not present

## 2016-03-03 DIAGNOSIS — I872 Venous insufficiency (chronic) (peripheral): Secondary | ICD-10-CM | POA: Diagnosis not present

## 2016-03-03 DIAGNOSIS — I10 Essential (primary) hypertension: Secondary | ICD-10-CM | POA: Diagnosis not present

## 2016-03-03 DIAGNOSIS — Z7982 Long term (current) use of aspirin: Secondary | ICD-10-CM | POA: Diagnosis not present

## 2016-03-03 DIAGNOSIS — I69318 Other symptoms and signs involving cognitive functions following cerebral infarction: Secondary | ICD-10-CM | POA: Diagnosis not present

## 2016-03-03 DIAGNOSIS — R2681 Unsteadiness on feet: Secondary | ICD-10-CM | POA: Diagnosis not present

## 2016-03-03 DIAGNOSIS — F0280 Dementia in other diseases classified elsewhere without behavioral disturbance: Secondary | ICD-10-CM | POA: Diagnosis not present

## 2016-03-04 DIAGNOSIS — G309 Alzheimer's disease, unspecified: Secondary | ICD-10-CM | POA: Diagnosis not present

## 2016-03-04 DIAGNOSIS — R2681 Unsteadiness on feet: Secondary | ICD-10-CM | POA: Diagnosis not present

## 2016-03-04 DIAGNOSIS — E782 Mixed hyperlipidemia: Secondary | ICD-10-CM | POA: Diagnosis not present

## 2016-03-04 DIAGNOSIS — Z7982 Long term (current) use of aspirin: Secondary | ICD-10-CM | POA: Diagnosis not present

## 2016-03-04 DIAGNOSIS — I69318 Other symptoms and signs involving cognitive functions following cerebral infarction: Secondary | ICD-10-CM | POA: Diagnosis not present

## 2016-03-04 DIAGNOSIS — I10 Essential (primary) hypertension: Secondary | ICD-10-CM | POA: Diagnosis not present

## 2016-03-04 DIAGNOSIS — Z950 Presence of cardiac pacemaker: Secondary | ICD-10-CM | POA: Diagnosis not present

## 2016-03-04 DIAGNOSIS — F0280 Dementia in other diseases classified elsewhere without behavioral disturbance: Secondary | ICD-10-CM | POA: Diagnosis not present

## 2016-03-04 DIAGNOSIS — I872 Venous insufficiency (chronic) (peripheral): Secondary | ICD-10-CM | POA: Diagnosis not present

## 2016-03-10 DIAGNOSIS — I10 Essential (primary) hypertension: Secondary | ICD-10-CM | POA: Diagnosis not present

## 2016-03-10 DIAGNOSIS — R2681 Unsteadiness on feet: Secondary | ICD-10-CM | POA: Diagnosis not present

## 2016-03-10 DIAGNOSIS — Z950 Presence of cardiac pacemaker: Secondary | ICD-10-CM | POA: Diagnosis not present

## 2016-03-10 DIAGNOSIS — I69318 Other symptoms and signs involving cognitive functions following cerebral infarction: Secondary | ICD-10-CM | POA: Diagnosis not present

## 2016-03-10 DIAGNOSIS — Z7982 Long term (current) use of aspirin: Secondary | ICD-10-CM | POA: Diagnosis not present

## 2016-03-10 DIAGNOSIS — E782 Mixed hyperlipidemia: Secondary | ICD-10-CM | POA: Diagnosis not present

## 2016-03-10 DIAGNOSIS — I872 Venous insufficiency (chronic) (peripheral): Secondary | ICD-10-CM | POA: Diagnosis not present

## 2016-03-10 DIAGNOSIS — F0280 Dementia in other diseases classified elsewhere without behavioral disturbance: Secondary | ICD-10-CM | POA: Diagnosis not present

## 2016-03-10 DIAGNOSIS — G309 Alzheimer's disease, unspecified: Secondary | ICD-10-CM | POA: Diagnosis not present

## 2016-03-13 DIAGNOSIS — W19XXXA Unspecified fall, initial encounter: Secondary | ICD-10-CM | POA: Diagnosis not present

## 2016-03-13 DIAGNOSIS — Z8542 Personal history of malignant neoplasm of other parts of uterus: Secondary | ICD-10-CM | POA: Diagnosis not present

## 2016-03-13 DIAGNOSIS — F0281 Dementia in other diseases classified elsewhere with behavioral disturbance: Secondary | ICD-10-CM | POA: Diagnosis not present

## 2016-03-13 DIAGNOSIS — I10 Essential (primary) hypertension: Secondary | ICD-10-CM | POA: Diagnosis not present

## 2016-03-13 DIAGNOSIS — R404 Transient alteration of awareness: Secondary | ICD-10-CM | POA: Diagnosis not present

## 2016-03-13 DIAGNOSIS — R531 Weakness: Secondary | ICD-10-CM | POA: Diagnosis not present

## 2016-03-13 DIAGNOSIS — W1839XA Other fall on same level, initial encounter: Secondary | ICD-10-CM | POA: Diagnosis not present

## 2016-03-13 DIAGNOSIS — Y92129 Unspecified place in nursing home as the place of occurrence of the external cause: Secondary | ICD-10-CM | POA: Diagnosis not present

## 2016-03-13 DIAGNOSIS — S0990XA Unspecified injury of head, initial encounter: Secondary | ICD-10-CM | POA: Diagnosis not present

## 2016-03-13 DIAGNOSIS — G309 Alzheimer's disease, unspecified: Secondary | ICD-10-CM | POA: Diagnosis not present

## 2016-03-13 DIAGNOSIS — N390 Urinary tract infection, site not specified: Secondary | ICD-10-CM | POA: Diagnosis not present

## 2016-03-13 DIAGNOSIS — Z8673 Personal history of transient ischemic attack (TIA), and cerebral infarction without residual deficits: Secondary | ICD-10-CM | POA: Diagnosis not present

## 2016-03-13 DIAGNOSIS — Z7982 Long term (current) use of aspirin: Secondary | ICD-10-CM | POA: Diagnosis not present

## 2016-03-13 DIAGNOSIS — E7800 Pure hypercholesterolemia, unspecified: Secondary | ICD-10-CM | POA: Diagnosis not present

## 2016-03-13 DIAGNOSIS — Z79899 Other long term (current) drug therapy: Secondary | ICD-10-CM | POA: Diagnosis not present

## 2016-03-13 DIAGNOSIS — F0390 Unspecified dementia without behavioral disturbance: Secondary | ICD-10-CM | POA: Diagnosis not present

## 2016-03-17 DIAGNOSIS — I69318 Other symptoms and signs involving cognitive functions following cerebral infarction: Secondary | ICD-10-CM | POA: Diagnosis not present

## 2016-03-17 DIAGNOSIS — Z7982 Long term (current) use of aspirin: Secondary | ICD-10-CM | POA: Diagnosis not present

## 2016-03-17 DIAGNOSIS — Z950 Presence of cardiac pacemaker: Secondary | ICD-10-CM | POA: Diagnosis not present

## 2016-03-17 DIAGNOSIS — I10 Essential (primary) hypertension: Secondary | ICD-10-CM | POA: Diagnosis not present

## 2016-03-17 DIAGNOSIS — R2681 Unsteadiness on feet: Secondary | ICD-10-CM | POA: Diagnosis not present

## 2016-03-17 DIAGNOSIS — E782 Mixed hyperlipidemia: Secondary | ICD-10-CM | POA: Diagnosis not present

## 2016-03-17 DIAGNOSIS — F0280 Dementia in other diseases classified elsewhere without behavioral disturbance: Secondary | ICD-10-CM | POA: Diagnosis not present

## 2016-03-17 DIAGNOSIS — I872 Venous insufficiency (chronic) (peripheral): Secondary | ICD-10-CM | POA: Diagnosis not present

## 2016-03-17 DIAGNOSIS — G309 Alzheimer's disease, unspecified: Secondary | ICD-10-CM | POA: Diagnosis not present

## 2016-03-19 DIAGNOSIS — S22009A Unspecified fracture of unspecified thoracic vertebra, initial encounter for closed fracture: Secondary | ICD-10-CM | POA: Diagnosis not present

## 2016-03-19 DIAGNOSIS — I10 Essential (primary) hypertension: Secondary | ICD-10-CM | POA: Diagnosis not present

## 2016-03-19 DIAGNOSIS — Z299 Encounter for prophylactic measures, unspecified: Secondary | ICD-10-CM | POA: Diagnosis not present

## 2016-03-19 DIAGNOSIS — Z79899 Other long term (current) drug therapy: Secondary | ICD-10-CM | POA: Diagnosis not present

## 2016-03-19 DIAGNOSIS — L97921 Non-pressure chronic ulcer of unspecified part of left lower leg limited to breakdown of skin: Secondary | ICD-10-CM | POA: Diagnosis not present

## 2016-03-19 DIAGNOSIS — R600 Localized edema: Secondary | ICD-10-CM | POA: Diagnosis not present

## 2016-03-19 DIAGNOSIS — Z713 Dietary counseling and surveillance: Secondary | ICD-10-CM | POA: Diagnosis not present

## 2016-03-19 DIAGNOSIS — Z6830 Body mass index (BMI) 30.0-30.9, adult: Secondary | ICD-10-CM | POA: Diagnosis not present

## 2016-03-23 DIAGNOSIS — Z79899 Other long term (current) drug therapy: Secondary | ICD-10-CM | POA: Diagnosis not present

## 2016-04-01 DIAGNOSIS — Z299 Encounter for prophylactic measures, unspecified: Secondary | ICD-10-CM | POA: Diagnosis not present

## 2016-04-01 DIAGNOSIS — Z6830 Body mass index (BMI) 30.0-30.9, adult: Secondary | ICD-10-CM | POA: Diagnosis not present

## 2016-04-01 DIAGNOSIS — I10 Essential (primary) hypertension: Secondary | ICD-10-CM | POA: Diagnosis not present

## 2016-04-01 DIAGNOSIS — R600 Localized edema: Secondary | ICD-10-CM | POA: Diagnosis not present

## 2016-04-01 DIAGNOSIS — E7800 Pure hypercholesterolemia, unspecified: Secondary | ICD-10-CM | POA: Diagnosis not present

## 2016-04-01 DIAGNOSIS — Z713 Dietary counseling and surveillance: Secondary | ICD-10-CM | POA: Diagnosis not present

## 2016-04-06 DIAGNOSIS — I351 Nonrheumatic aortic (valve) insufficiency: Secondary | ICD-10-CM | POA: Diagnosis not present

## 2016-04-06 DIAGNOSIS — R600 Localized edema: Secondary | ICD-10-CM | POA: Diagnosis not present

## 2016-04-16 DIAGNOSIS — G4489 Other headache syndrome: Secondary | ICD-10-CM | POA: Diagnosis not present

## 2016-04-16 DIAGNOSIS — Z8542 Personal history of malignant neoplasm of other parts of uterus: Secondary | ICD-10-CM | POA: Diagnosis not present

## 2016-04-16 DIAGNOSIS — R269 Unspecified abnormalities of gait and mobility: Secondary | ICD-10-CM | POA: Diagnosis not present

## 2016-04-16 DIAGNOSIS — E7800 Pure hypercholesterolemia, unspecified: Secondary | ICD-10-CM | POA: Diagnosis not present

## 2016-04-16 DIAGNOSIS — F0390 Unspecified dementia without behavioral disturbance: Secondary | ICD-10-CM | POA: Diagnosis not present

## 2016-04-16 DIAGNOSIS — Z7982 Long term (current) use of aspirin: Secondary | ICD-10-CM | POA: Diagnosis not present

## 2016-04-16 DIAGNOSIS — I10 Essential (primary) hypertension: Secondary | ICD-10-CM | POA: Diagnosis not present

## 2016-04-16 DIAGNOSIS — S199XXA Unspecified injury of neck, initial encounter: Secondary | ICD-10-CM | POA: Diagnosis not present

## 2016-04-16 DIAGNOSIS — S0003XA Contusion of scalp, initial encounter: Secondary | ICD-10-CM | POA: Diagnosis not present

## 2016-04-16 DIAGNOSIS — S0990XA Unspecified injury of head, initial encounter: Secondary | ICD-10-CM | POA: Diagnosis not present

## 2016-04-16 DIAGNOSIS — Z79899 Other long term (current) drug therapy: Secondary | ICD-10-CM | POA: Diagnosis not present

## 2016-04-16 DIAGNOSIS — Y92122 Bedroom in nursing home as the place of occurrence of the external cause: Secondary | ICD-10-CM | POA: Diagnosis not present

## 2016-04-16 DIAGNOSIS — I6789 Other cerebrovascular disease: Secondary | ICD-10-CM | POA: Diagnosis not present

## 2016-04-16 DIAGNOSIS — Z8673 Personal history of transient ischemic attack (TIA), and cerebral infarction without residual deficits: Secondary | ICD-10-CM | POA: Diagnosis not present

## 2016-04-16 DIAGNOSIS — R279 Unspecified lack of coordination: Secondary | ICD-10-CM | POA: Diagnosis not present

## 2016-04-16 DIAGNOSIS — Z743 Need for continuous supervision: Secondary | ICD-10-CM | POA: Diagnosis not present

## 2016-04-16 DIAGNOSIS — R410 Disorientation, unspecified: Secondary | ICD-10-CM | POA: Diagnosis not present

## 2016-04-16 DIAGNOSIS — W1809XA Striking against other object with subsequent fall, initial encounter: Secondary | ICD-10-CM | POA: Diagnosis not present

## 2016-04-16 DIAGNOSIS — W1839XA Other fall on same level, initial encounter: Secondary | ICD-10-CM | POA: Diagnosis not present

## 2016-04-16 DIAGNOSIS — S6991XA Unspecified injury of right wrist, hand and finger(s), initial encounter: Secondary | ICD-10-CM | POA: Diagnosis not present

## 2016-04-16 DIAGNOSIS — S51011A Laceration without foreign body of right elbow, initial encounter: Secondary | ICD-10-CM | POA: Diagnosis not present

## 2016-04-17 DIAGNOSIS — Z8673 Personal history of transient ischemic attack (TIA), and cerebral infarction without residual deficits: Secondary | ICD-10-CM | POA: Diagnosis not present

## 2016-04-17 DIAGNOSIS — F0280 Dementia in other diseases classified elsewhere without behavioral disturbance: Secondary | ICD-10-CM | POA: Diagnosis not present

## 2016-04-17 DIAGNOSIS — E7800 Pure hypercholesterolemia, unspecified: Secondary | ICD-10-CM | POA: Diagnosis not present

## 2016-04-17 DIAGNOSIS — R0902 Hypoxemia: Secondary | ICD-10-CM | POA: Diagnosis not present

## 2016-04-17 DIAGNOSIS — I2699 Other pulmonary embolism without acute cor pulmonale: Secondary | ICD-10-CM | POA: Diagnosis not present

## 2016-04-17 DIAGNOSIS — I4891 Unspecified atrial fibrillation: Secondary | ICD-10-CM | POA: Diagnosis not present

## 2016-04-17 DIAGNOSIS — M810 Age-related osteoporosis without current pathological fracture: Secondary | ICD-10-CM | POA: Diagnosis not present

## 2016-04-17 DIAGNOSIS — Y92129 Unspecified place in nursing home as the place of occurrence of the external cause: Secondary | ICD-10-CM | POA: Diagnosis not present

## 2016-04-17 DIAGNOSIS — S50311A Abrasion of right elbow, initial encounter: Secondary | ICD-10-CM | POA: Diagnosis not present

## 2016-04-17 DIAGNOSIS — R079 Chest pain, unspecified: Secondary | ICD-10-CM | POA: Diagnosis not present

## 2016-04-17 DIAGNOSIS — G309 Alzheimer's disease, unspecified: Secondary | ICD-10-CM | POA: Diagnosis not present

## 2016-04-17 DIAGNOSIS — F0390 Unspecified dementia without behavioral disturbance: Secondary | ICD-10-CM | POA: Diagnosis not present

## 2016-04-17 DIAGNOSIS — W19XXXA Unspecified fall, initial encounter: Secondary | ICD-10-CM | POA: Diagnosis not present

## 2016-04-17 DIAGNOSIS — Z888 Allergy status to other drugs, medicaments and biological substances status: Secondary | ICD-10-CM | POA: Diagnosis not present

## 2016-04-17 DIAGNOSIS — M1990 Unspecified osteoarthritis, unspecified site: Secondary | ICD-10-CM | POA: Diagnosis not present

## 2016-04-17 DIAGNOSIS — R030 Elevated blood-pressure reading, without diagnosis of hypertension: Secondary | ICD-10-CM | POA: Diagnosis not present

## 2016-04-17 DIAGNOSIS — Z950 Presence of cardiac pacemaker: Secondary | ICD-10-CM | POA: Diagnosis not present

## 2016-04-17 DIAGNOSIS — Z66 Do not resuscitate: Secondary | ICD-10-CM | POA: Diagnosis not present

## 2016-04-17 DIAGNOSIS — I10 Essential (primary) hypertension: Secondary | ICD-10-CM | POA: Diagnosis not present

## 2016-04-17 DIAGNOSIS — I110 Hypertensive heart disease with heart failure: Secondary | ICD-10-CM | POA: Diagnosis not present

## 2016-04-17 DIAGNOSIS — Z79899 Other long term (current) drug therapy: Secondary | ICD-10-CM | POA: Diagnosis not present

## 2016-04-17 DIAGNOSIS — R404 Transient alteration of awareness: Secondary | ICD-10-CM | POA: Diagnosis not present

## 2016-04-17 DIAGNOSIS — Z8542 Personal history of malignant neoplasm of other parts of uterus: Secondary | ICD-10-CM | POA: Diagnosis not present

## 2016-04-17 DIAGNOSIS — S60511A Abrasion of right hand, initial encounter: Secondary | ICD-10-CM | POA: Diagnosis not present

## 2016-04-17 DIAGNOSIS — I509 Heart failure, unspecified: Secondary | ICD-10-CM | POA: Diagnosis not present

## 2016-04-17 DIAGNOSIS — I712 Thoracic aortic aneurysm, without rupture: Secondary | ICD-10-CM | POA: Diagnosis not present

## 2016-04-20 DIAGNOSIS — F0280 Dementia in other diseases classified elsewhere without behavioral disturbance: Secondary | ICD-10-CM | POA: Diagnosis not present

## 2016-04-20 DIAGNOSIS — G309 Alzheimer's disease, unspecified: Secondary | ICD-10-CM | POA: Diagnosis not present

## 2016-04-20 DIAGNOSIS — I712 Thoracic aortic aneurysm, without rupture: Secondary | ICD-10-CM | POA: Diagnosis not present

## 2016-04-20 DIAGNOSIS — I2699 Other pulmonary embolism without acute cor pulmonale: Secondary | ICD-10-CM | POA: Diagnosis not present

## 2016-04-21 DIAGNOSIS — I712 Thoracic aortic aneurysm, without rupture: Secondary | ICD-10-CM | POA: Diagnosis not present

## 2016-04-21 DIAGNOSIS — G309 Alzheimer's disease, unspecified: Secondary | ICD-10-CM | POA: Diagnosis not present

## 2016-04-21 DIAGNOSIS — I2699 Other pulmonary embolism without acute cor pulmonale: Secondary | ICD-10-CM | POA: Diagnosis not present

## 2016-04-21 DIAGNOSIS — F0280 Dementia in other diseases classified elsewhere without behavioral disturbance: Secondary | ICD-10-CM | POA: Diagnosis not present

## 2016-04-22 DIAGNOSIS — F0280 Dementia in other diseases classified elsewhere without behavioral disturbance: Secondary | ICD-10-CM | POA: Diagnosis not present

## 2016-04-22 DIAGNOSIS — G309 Alzheimer's disease, unspecified: Secondary | ICD-10-CM | POA: Diagnosis not present

## 2016-04-22 DIAGNOSIS — I2699 Other pulmonary embolism without acute cor pulmonale: Secondary | ICD-10-CM | POA: Diagnosis not present

## 2016-04-22 DIAGNOSIS — I10 Essential (primary) hypertension: Secondary | ICD-10-CM | POA: Diagnosis not present

## 2016-04-22 DIAGNOSIS — G4700 Insomnia, unspecified: Secondary | ICD-10-CM | POA: Diagnosis not present

## 2016-04-22 DIAGNOSIS — R2681 Unsteadiness on feet: Secondary | ICD-10-CM | POA: Diagnosis not present

## 2016-04-22 DIAGNOSIS — I712 Thoracic aortic aneurysm, without rupture: Secondary | ICD-10-CM | POA: Diagnosis not present

## 2016-04-22 DIAGNOSIS — E785 Hyperlipidemia, unspecified: Secondary | ICD-10-CM | POA: Diagnosis not present

## 2016-04-22 DIAGNOSIS — M6281 Muscle weakness (generalized): Secondary | ICD-10-CM | POA: Diagnosis not present

## 2016-04-22 DIAGNOSIS — K5900 Constipation, unspecified: Secondary | ICD-10-CM | POA: Diagnosis not present

## 2016-04-23 DIAGNOSIS — F0390 Unspecified dementia without behavioral disturbance: Secondary | ICD-10-CM | POA: Diagnosis not present

## 2016-04-23 DIAGNOSIS — I2699 Other pulmonary embolism without acute cor pulmonale: Secondary | ICD-10-CM | POA: Diagnosis not present

## 2016-04-23 DIAGNOSIS — R451 Restlessness and agitation: Secondary | ICD-10-CM | POA: Diagnosis not present

## 2016-05-06 DIAGNOSIS — F0280 Dementia in other diseases classified elsewhere without behavioral disturbance: Secondary | ICD-10-CM | POA: Diagnosis not present

## 2016-05-06 DIAGNOSIS — I639 Cerebral infarction, unspecified: Secondary | ICD-10-CM | POA: Diagnosis not present

## 2016-05-06 DIAGNOSIS — I10 Essential (primary) hypertension: Secondary | ICD-10-CM | POA: Diagnosis not present

## 2016-05-06 DIAGNOSIS — G309 Alzheimer's disease, unspecified: Secondary | ICD-10-CM | POA: Diagnosis not present

## 2016-05-19 DIAGNOSIS — I2699 Other pulmonary embolism without acute cor pulmonale: Secondary | ICD-10-CM | POA: Diagnosis not present

## 2016-05-19 DIAGNOSIS — I10 Essential (primary) hypertension: Secondary | ICD-10-CM | POA: Diagnosis not present

## 2016-05-19 DIAGNOSIS — E785 Hyperlipidemia, unspecified: Secondary | ICD-10-CM | POA: Diagnosis not present

## 2016-05-19 DIAGNOSIS — M6281 Muscle weakness (generalized): Secondary | ICD-10-CM | POA: Diagnosis not present

## 2016-05-19 DIAGNOSIS — I712 Thoracic aortic aneurysm, without rupture: Secondary | ICD-10-CM | POA: Diagnosis not present

## 2016-05-19 DIAGNOSIS — R2681 Unsteadiness on feet: Secondary | ICD-10-CM | POA: Diagnosis not present

## 2016-05-19 DIAGNOSIS — G309 Alzheimer's disease, unspecified: Secondary | ICD-10-CM | POA: Diagnosis not present

## 2016-05-19 DIAGNOSIS — G4700 Insomnia, unspecified: Secondary | ICD-10-CM | POA: Diagnosis not present

## 2016-05-19 DIAGNOSIS — K5900 Constipation, unspecified: Secondary | ICD-10-CM | POA: Diagnosis not present

## 2016-05-19 DIAGNOSIS — F0280 Dementia in other diseases classified elsewhere without behavioral disturbance: Secondary | ICD-10-CM | POA: Diagnosis not present

## 2016-05-23 DIAGNOSIS — M25552 Pain in left hip: Secondary | ICD-10-CM | POA: Diagnosis not present

## 2016-05-25 DIAGNOSIS — M25562 Pain in left knee: Secondary | ICD-10-CM | POA: Diagnosis not present

## 2016-05-25 DIAGNOSIS — M79672 Pain in left foot: Secondary | ICD-10-CM | POA: Diagnosis not present

## 2016-05-25 DIAGNOSIS — M79652 Pain in left thigh: Secondary | ICD-10-CM | POA: Diagnosis not present

## 2016-05-25 DIAGNOSIS — M79662 Pain in left lower leg: Secondary | ICD-10-CM | POA: Diagnosis not present

## 2016-05-25 DIAGNOSIS — M25572 Pain in left ankle and joints of left foot: Secondary | ICD-10-CM | POA: Diagnosis not present

## 2016-05-26 DIAGNOSIS — I2699 Other pulmonary embolism without acute cor pulmonale: Secondary | ICD-10-CM | POA: Diagnosis not present

## 2016-05-26 DIAGNOSIS — M25559 Pain in unspecified hip: Secondary | ICD-10-CM | POA: Diagnosis not present

## 2016-05-26 DIAGNOSIS — F0390 Unspecified dementia without behavioral disturbance: Secondary | ICD-10-CM | POA: Diagnosis not present

## 2016-06-19 DIAGNOSIS — R2681 Unsteadiness on feet: Secondary | ICD-10-CM | POA: Diagnosis not present

## 2016-06-19 DIAGNOSIS — G4700 Insomnia, unspecified: Secondary | ICD-10-CM | POA: Diagnosis not present

## 2016-06-19 DIAGNOSIS — I2699 Other pulmonary embolism without acute cor pulmonale: Secondary | ICD-10-CM | POA: Diagnosis not present

## 2016-06-19 DIAGNOSIS — F0390 Unspecified dementia without behavioral disturbance: Secondary | ICD-10-CM | POA: Diagnosis not present

## 2016-06-19 DIAGNOSIS — K5900 Constipation, unspecified: Secondary | ICD-10-CM | POA: Diagnosis not present

## 2016-06-19 DIAGNOSIS — E785 Hyperlipidemia, unspecified: Secondary | ICD-10-CM | POA: Diagnosis not present

## 2016-06-19 DIAGNOSIS — I10 Essential (primary) hypertension: Secondary | ICD-10-CM | POA: Diagnosis not present

## 2016-06-19 DIAGNOSIS — M6281 Muscle weakness (generalized): Secondary | ICD-10-CM | POA: Diagnosis not present

## 2016-06-19 DIAGNOSIS — F0280 Dementia in other diseases classified elsewhere without behavioral disturbance: Secondary | ICD-10-CM | POA: Diagnosis not present

## 2016-06-19 DIAGNOSIS — I712 Thoracic aortic aneurysm, without rupture: Secondary | ICD-10-CM | POA: Diagnosis not present

## 2016-06-19 DIAGNOSIS — G309 Alzheimer's disease, unspecified: Secondary | ICD-10-CM | POA: Diagnosis not present

## 2016-06-24 DIAGNOSIS — Z7982 Long term (current) use of aspirin: Secondary | ICD-10-CM | POA: Diagnosis not present

## 2016-06-24 DIAGNOSIS — K5900 Constipation, unspecified: Secondary | ICD-10-CM | POA: Diagnosis not present

## 2016-06-24 DIAGNOSIS — E785 Hyperlipidemia, unspecified: Secondary | ICD-10-CM | POA: Diagnosis not present

## 2016-06-24 DIAGNOSIS — G8929 Other chronic pain: Secondary | ICD-10-CM | POA: Diagnosis not present

## 2016-06-24 DIAGNOSIS — E559 Vitamin D deficiency, unspecified: Secondary | ICD-10-CM | POA: Diagnosis not present

## 2016-06-24 DIAGNOSIS — K219 Gastro-esophageal reflux disease without esophagitis: Secondary | ICD-10-CM | POA: Diagnosis not present

## 2016-06-24 DIAGNOSIS — I2782 Chronic pulmonary embolism: Secondary | ICD-10-CM | POA: Diagnosis not present

## 2016-06-24 DIAGNOSIS — H6120 Impacted cerumen, unspecified ear: Secondary | ICD-10-CM | POA: Diagnosis not present

## 2016-06-24 DIAGNOSIS — I10 Essential (primary) hypertension: Secondary | ICD-10-CM | POA: Diagnosis not present

## 2016-06-25 DIAGNOSIS — G309 Alzheimer's disease, unspecified: Secondary | ICD-10-CM | POA: Diagnosis not present

## 2016-06-25 DIAGNOSIS — G4700 Insomnia, unspecified: Secondary | ICD-10-CM | POA: Diagnosis not present

## 2016-06-25 DIAGNOSIS — F0280 Dementia in other diseases classified elsewhere without behavioral disturbance: Secondary | ICD-10-CM | POA: Diagnosis not present

## 2016-06-25 DIAGNOSIS — F338 Other recurrent depressive disorders: Secondary | ICD-10-CM | POA: Diagnosis not present

## 2016-06-30 DIAGNOSIS — B351 Tinea unguium: Secondary | ICD-10-CM | POA: Diagnosis not present

## 2016-06-30 DIAGNOSIS — M79675 Pain in left toe(s): Secondary | ICD-10-CM | POA: Diagnosis not present

## 2016-07-01 DIAGNOSIS — I10 Essential (primary) hypertension: Secondary | ICD-10-CM | POA: Diagnosis not present

## 2016-07-08 DIAGNOSIS — E782 Mixed hyperlipidemia: Secondary | ICD-10-CM | POA: Diagnosis not present

## 2016-07-08 DIAGNOSIS — E039 Hypothyroidism, unspecified: Secondary | ICD-10-CM | POA: Diagnosis not present

## 2016-07-08 DIAGNOSIS — I10 Essential (primary) hypertension: Secondary | ICD-10-CM | POA: Diagnosis not present

## 2016-07-08 DIAGNOSIS — E119 Type 2 diabetes mellitus without complications: Secondary | ICD-10-CM | POA: Diagnosis not present

## 2016-07-09 DIAGNOSIS — G4700 Insomnia, unspecified: Secondary | ICD-10-CM | POA: Diagnosis not present

## 2016-07-09 DIAGNOSIS — G309 Alzheimer's disease, unspecified: Secondary | ICD-10-CM | POA: Diagnosis not present

## 2016-07-09 DIAGNOSIS — F0280 Dementia in other diseases classified elsewhere without behavioral disturbance: Secondary | ICD-10-CM | POA: Diagnosis not present

## 2016-07-27 DIAGNOSIS — K5900 Constipation, unspecified: Secondary | ICD-10-CM | POA: Diagnosis not present

## 2016-07-27 DIAGNOSIS — E559 Vitamin D deficiency, unspecified: Secondary | ICD-10-CM | POA: Diagnosis not present

## 2016-07-27 DIAGNOSIS — E785 Hyperlipidemia, unspecified: Secondary | ICD-10-CM | POA: Diagnosis not present

## 2016-08-06 DIAGNOSIS — F0280 Dementia in other diseases classified elsewhere without behavioral disturbance: Secondary | ICD-10-CM | POA: Diagnosis not present

## 2016-08-06 DIAGNOSIS — G4700 Insomnia, unspecified: Secondary | ICD-10-CM | POA: Diagnosis not present

## 2016-08-06 DIAGNOSIS — G309 Alzheimer's disease, unspecified: Secondary | ICD-10-CM | POA: Diagnosis not present

## 2016-08-17 DIAGNOSIS — K219 Gastro-esophageal reflux disease without esophagitis: Secondary | ICD-10-CM | POA: Diagnosis not present

## 2016-08-17 DIAGNOSIS — F0280 Dementia in other diseases classified elsewhere without behavioral disturbance: Secondary | ICD-10-CM | POA: Diagnosis not present

## 2016-08-17 DIAGNOSIS — R52 Pain, unspecified: Secondary | ICD-10-CM | POA: Diagnosis not present

## 2016-09-01 DIAGNOSIS — G309 Alzheimer's disease, unspecified: Secondary | ICD-10-CM | POA: Diagnosis not present

## 2016-09-01 DIAGNOSIS — G4700 Insomnia, unspecified: Secondary | ICD-10-CM | POA: Diagnosis not present

## 2016-09-01 DIAGNOSIS — F0280 Dementia in other diseases classified elsewhere without behavioral disturbance: Secondary | ICD-10-CM | POA: Diagnosis not present

## 2016-09-09 DIAGNOSIS — E559 Vitamin D deficiency, unspecified: Secondary | ICD-10-CM | POA: Diagnosis not present

## 2016-09-17 DIAGNOSIS — G4700 Insomnia, unspecified: Secondary | ICD-10-CM | POA: Diagnosis not present

## 2016-09-17 DIAGNOSIS — E559 Vitamin D deficiency, unspecified: Secondary | ICD-10-CM | POA: Diagnosis not present

## 2016-09-17 DIAGNOSIS — F0280 Dementia in other diseases classified elsewhere without behavioral disturbance: Secondary | ICD-10-CM | POA: Diagnosis not present

## 2016-09-21 DIAGNOSIS — Z86711 Personal history of pulmonary embolism: Secondary | ICD-10-CM | POA: Diagnosis not present

## 2016-09-21 DIAGNOSIS — E785 Hyperlipidemia, unspecified: Secondary | ICD-10-CM | POA: Diagnosis not present

## 2016-09-21 DIAGNOSIS — I10 Essential (primary) hypertension: Secondary | ICD-10-CM | POA: Diagnosis not present

## 2016-09-23 DIAGNOSIS — F0280 Dementia in other diseases classified elsewhere without behavioral disturbance: Secondary | ICD-10-CM | POA: Diagnosis not present

## 2016-09-23 DIAGNOSIS — M79674 Pain in right toe(s): Secondary | ICD-10-CM | POA: Diagnosis not present

## 2016-09-23 DIAGNOSIS — M79675 Pain in left toe(s): Secondary | ICD-10-CM | POA: Diagnosis not present

## 2016-09-23 DIAGNOSIS — B351 Tinea unguium: Secondary | ICD-10-CM | POA: Diagnosis not present

## 2016-10-07 DIAGNOSIS — G309 Alzheimer's disease, unspecified: Secondary | ICD-10-CM | POA: Diagnosis not present

## 2016-10-07 DIAGNOSIS — G4700 Insomnia, unspecified: Secondary | ICD-10-CM | POA: Diagnosis not present

## 2016-10-07 DIAGNOSIS — F0280 Dementia in other diseases classified elsewhere without behavioral disturbance: Secondary | ICD-10-CM | POA: Diagnosis not present

## 2016-10-08 ENCOUNTER — Ambulatory Visit (INDEPENDENT_AMBULATORY_CARE_PROVIDER_SITE_OTHER): Payer: PPO | Admitting: Otolaryngology

## 2016-10-08 DIAGNOSIS — H6123 Impacted cerumen, bilateral: Secondary | ICD-10-CM

## 2016-10-08 DIAGNOSIS — H903 Sensorineural hearing loss, bilateral: Secondary | ICD-10-CM

## 2016-10-14 DIAGNOSIS — E559 Vitamin D deficiency, unspecified: Secondary | ICD-10-CM | POA: Diagnosis not present

## 2016-10-14 DIAGNOSIS — G4700 Insomnia, unspecified: Secondary | ICD-10-CM | POA: Diagnosis not present

## 2016-10-14 DIAGNOSIS — F0280 Dementia in other diseases classified elsewhere without behavioral disturbance: Secondary | ICD-10-CM | POA: Diagnosis not present

## 2016-10-14 DIAGNOSIS — R52 Pain, unspecified: Secondary | ICD-10-CM | POA: Diagnosis not present

## 2016-10-18 DIAGNOSIS — K5900 Constipation, unspecified: Secondary | ICD-10-CM | POA: Diagnosis not present

## 2016-10-18 DIAGNOSIS — E785 Hyperlipidemia, unspecified: Secondary | ICD-10-CM | POA: Diagnosis not present

## 2016-10-18 DIAGNOSIS — I10 Essential (primary) hypertension: Secondary | ICD-10-CM | POA: Diagnosis not present

## 2016-10-21 DIAGNOSIS — Z5181 Encounter for therapeutic drug level monitoring: Secondary | ICD-10-CM | POA: Diagnosis not present

## 2016-10-26 DIAGNOSIS — E559 Vitamin D deficiency, unspecified: Secondary | ICD-10-CM | POA: Diagnosis not present

## 2016-10-26 DIAGNOSIS — Z86711 Personal history of pulmonary embolism: Secondary | ICD-10-CM | POA: Diagnosis not present

## 2016-10-26 DIAGNOSIS — I10 Essential (primary) hypertension: Secondary | ICD-10-CM | POA: Diagnosis not present

## 2016-10-26 DIAGNOSIS — K219 Gastro-esophageal reflux disease without esophagitis: Secondary | ICD-10-CM | POA: Diagnosis not present

## 2016-10-26 DIAGNOSIS — R52 Pain, unspecified: Secondary | ICD-10-CM | POA: Diagnosis not present

## 2016-10-26 DIAGNOSIS — E785 Hyperlipidemia, unspecified: Secondary | ICD-10-CM | POA: Diagnosis not present

## 2016-11-06 DIAGNOSIS — G309 Alzheimer's disease, unspecified: Secondary | ICD-10-CM | POA: Diagnosis not present

## 2016-11-06 DIAGNOSIS — G4700 Insomnia, unspecified: Secondary | ICD-10-CM | POA: Diagnosis not present

## 2016-11-06 DIAGNOSIS — F0280 Dementia in other diseases classified elsewhere without behavioral disturbance: Secondary | ICD-10-CM | POA: Diagnosis not present

## 2016-11-16 DIAGNOSIS — F0280 Dementia in other diseases classified elsewhere without behavioral disturbance: Secondary | ICD-10-CM | POA: Diagnosis not present

## 2016-11-16 DIAGNOSIS — R52 Pain, unspecified: Secondary | ICD-10-CM | POA: Diagnosis not present

## 2016-11-16 DIAGNOSIS — Z86711 Personal history of pulmonary embolism: Secondary | ICD-10-CM | POA: Diagnosis not present

## 2016-11-16 DIAGNOSIS — I10 Essential (primary) hypertension: Secondary | ICD-10-CM | POA: Diagnosis not present

## 2016-11-16 DIAGNOSIS — G4700 Insomnia, unspecified: Secondary | ICD-10-CM | POA: Diagnosis not present

## 2016-11-16 DIAGNOSIS — E559 Vitamin D deficiency, unspecified: Secondary | ICD-10-CM | POA: Diagnosis not present

## 2016-11-24 DIAGNOSIS — E785 Hyperlipidemia, unspecified: Secondary | ICD-10-CM | POA: Diagnosis not present

## 2016-11-24 DIAGNOSIS — K5901 Slow transit constipation: Secondary | ICD-10-CM | POA: Diagnosis not present

## 2016-11-24 DIAGNOSIS — H6123 Impacted cerumen, bilateral: Secondary | ICD-10-CM | POA: Diagnosis not present

## 2016-11-24 DIAGNOSIS — K219 Gastro-esophageal reflux disease without esophagitis: Secondary | ICD-10-CM | POA: Diagnosis not present

## 2016-11-24 DIAGNOSIS — Z86711 Personal history of pulmonary embolism: Secondary | ICD-10-CM | POA: Diagnosis not present

## 2016-11-24 DIAGNOSIS — E559 Vitamin D deficiency, unspecified: Secondary | ICD-10-CM | POA: Diagnosis not present

## 2016-11-24 DIAGNOSIS — R52 Pain, unspecified: Secondary | ICD-10-CM | POA: Diagnosis not present

## 2016-11-24 DIAGNOSIS — I10 Essential (primary) hypertension: Secondary | ICD-10-CM | POA: Diagnosis not present

## 2016-12-08 DIAGNOSIS — M79674 Pain in right toe(s): Secondary | ICD-10-CM | POA: Diagnosis not present

## 2016-12-08 DIAGNOSIS — M79675 Pain in left toe(s): Secondary | ICD-10-CM | POA: Diagnosis not present

## 2016-12-08 DIAGNOSIS — B351 Tinea unguium: Secondary | ICD-10-CM | POA: Diagnosis not present

## 2016-12-14 DIAGNOSIS — R609 Edema, unspecified: Secondary | ICD-10-CM | POA: Diagnosis not present

## 2016-12-21 DIAGNOSIS — R197 Diarrhea, unspecified: Secondary | ICD-10-CM | POA: Diagnosis not present

## 2016-12-21 DIAGNOSIS — R52 Pain, unspecified: Secondary | ICD-10-CM | POA: Diagnosis not present

## 2016-12-21 DIAGNOSIS — R609 Edema, unspecified: Secondary | ICD-10-CM | POA: Diagnosis not present

## 2016-12-21 DIAGNOSIS — I10 Essential (primary) hypertension: Secondary | ICD-10-CM | POA: Diagnosis not present

## 2017-01-06 DIAGNOSIS — I10 Essential (primary) hypertension: Secondary | ICD-10-CM | POA: Diagnosis not present

## 2017-01-06 DIAGNOSIS — R609 Edema, unspecified: Secondary | ICD-10-CM | POA: Diagnosis not present

## 2017-01-25 DIAGNOSIS — Z0000 Encounter for general adult medical examination without abnormal findings: Secondary | ICD-10-CM | POA: Diagnosis not present

## 2017-01-29 DIAGNOSIS — G309 Alzheimer's disease, unspecified: Secondary | ICD-10-CM | POA: Diagnosis not present

## 2017-01-29 DIAGNOSIS — G4700 Insomnia, unspecified: Secondary | ICD-10-CM | POA: Diagnosis not present

## 2017-01-29 DIAGNOSIS — F0280 Dementia in other diseases classified elsewhere without behavioral disturbance: Secondary | ICD-10-CM | POA: Diagnosis not present

## 2017-03-01 DIAGNOSIS — E785 Hyperlipidemia, unspecified: Secondary | ICD-10-CM | POA: Diagnosis not present

## 2017-03-01 DIAGNOSIS — R630 Anorexia: Secondary | ICD-10-CM | POA: Diagnosis not present

## 2017-03-01 DIAGNOSIS — R197 Diarrhea, unspecified: Secondary | ICD-10-CM | POA: Diagnosis not present

## 2017-03-01 DIAGNOSIS — I10 Essential (primary) hypertension: Secondary | ICD-10-CM | POA: Diagnosis not present

## 2017-03-01 DIAGNOSIS — Z7982 Long term (current) use of aspirin: Secondary | ICD-10-CM | POA: Diagnosis not present

## 2017-03-01 DIAGNOSIS — R609 Edema, unspecified: Secondary | ICD-10-CM | POA: Diagnosis not present

## 2017-03-01 DIAGNOSIS — Z86711 Personal history of pulmonary embolism: Secondary | ICD-10-CM | POA: Diagnosis not present

## 2017-03-01 DIAGNOSIS — R52 Pain, unspecified: Secondary | ICD-10-CM | POA: Diagnosis not present

## 2017-03-17 DIAGNOSIS — F0280 Dementia in other diseases classified elsewhere without behavioral disturbance: Secondary | ICD-10-CM | POA: Diagnosis not present

## 2017-03-17 DIAGNOSIS — G309 Alzheimer's disease, unspecified: Secondary | ICD-10-CM | POA: Diagnosis not present

## 2017-03-29 DIAGNOSIS — Z86711 Personal history of pulmonary embolism: Secondary | ICD-10-CM | POA: Diagnosis not present

## 2017-03-29 DIAGNOSIS — R630 Anorexia: Secondary | ICD-10-CM | POA: Diagnosis not present

## 2017-03-29 DIAGNOSIS — K219 Gastro-esophageal reflux disease without esophagitis: Secondary | ICD-10-CM | POA: Diagnosis not present

## 2017-03-29 DIAGNOSIS — I10 Essential (primary) hypertension: Secondary | ICD-10-CM | POA: Diagnosis not present

## 2017-03-29 DIAGNOSIS — R197 Diarrhea, unspecified: Secondary | ICD-10-CM | POA: Diagnosis not present

## 2017-03-29 DIAGNOSIS — E785 Hyperlipidemia, unspecified: Secondary | ICD-10-CM | POA: Diagnosis not present

## 2017-03-29 DIAGNOSIS — R609 Edema, unspecified: Secondary | ICD-10-CM | POA: Diagnosis not present

## 2017-03-29 DIAGNOSIS — Z7982 Long term (current) use of aspirin: Secondary | ICD-10-CM | POA: Diagnosis not present

## 2017-03-29 DIAGNOSIS — E559 Vitamin D deficiency, unspecified: Secondary | ICD-10-CM | POA: Diagnosis not present

## 2017-03-29 DIAGNOSIS — R52 Pain, unspecified: Secondary | ICD-10-CM | POA: Diagnosis not present

## 2017-04-14 DIAGNOSIS — G309 Alzheimer's disease, unspecified: Secondary | ICD-10-CM | POA: Diagnosis not present

## 2017-04-14 DIAGNOSIS — F0281 Dementia in other diseases classified elsewhere with behavioral disturbance: Secondary | ICD-10-CM | POA: Diagnosis not present

## 2017-04-15 ENCOUNTER — Ambulatory Visit (INDEPENDENT_AMBULATORY_CARE_PROVIDER_SITE_OTHER): Payer: PPO | Admitting: Otolaryngology

## 2017-04-15 DIAGNOSIS — H6123 Impacted cerumen, bilateral: Secondary | ICD-10-CM | POA: Diagnosis not present

## 2017-04-26 DIAGNOSIS — E559 Vitamin D deficiency, unspecified: Secondary | ICD-10-CM | POA: Diagnosis not present

## 2017-04-26 DIAGNOSIS — Z86711 Personal history of pulmonary embolism: Secondary | ICD-10-CM | POA: Diagnosis not present

## 2017-04-26 DIAGNOSIS — R609 Edema, unspecified: Secondary | ICD-10-CM | POA: Diagnosis not present

## 2017-04-26 DIAGNOSIS — E785 Hyperlipidemia, unspecified: Secondary | ICD-10-CM | POA: Diagnosis not present

## 2017-04-26 DIAGNOSIS — I10 Essential (primary) hypertension: Secondary | ICD-10-CM | POA: Diagnosis not present

## 2017-04-26 DIAGNOSIS — K5901 Slow transit constipation: Secondary | ICD-10-CM | POA: Diagnosis not present

## 2017-04-26 DIAGNOSIS — Z7982 Long term (current) use of aspirin: Secondary | ICD-10-CM | POA: Diagnosis not present

## 2017-04-26 DIAGNOSIS — R630 Anorexia: Secondary | ICD-10-CM | POA: Diagnosis not present

## 2017-04-26 DIAGNOSIS — R52 Pain, unspecified: Secondary | ICD-10-CM | POA: Diagnosis not present

## 2017-04-26 DIAGNOSIS — K219 Gastro-esophageal reflux disease without esophagitis: Secondary | ICD-10-CM | POA: Diagnosis not present

## 2017-04-30 DIAGNOSIS — K5901 Slow transit constipation: Secondary | ICD-10-CM | POA: Diagnosis not present

## 2017-04-30 DIAGNOSIS — R609 Edema, unspecified: Secondary | ICD-10-CM | POA: Diagnosis not present

## 2017-04-30 DIAGNOSIS — E785 Hyperlipidemia, unspecified: Secondary | ICD-10-CM | POA: Diagnosis not present

## 2017-04-30 DIAGNOSIS — R52 Pain, unspecified: Secondary | ICD-10-CM | POA: Diagnosis not present

## 2017-04-30 DIAGNOSIS — R630 Anorexia: Secondary | ICD-10-CM | POA: Diagnosis not present

## 2017-04-30 DIAGNOSIS — Z7982 Long term (current) use of aspirin: Secondary | ICD-10-CM | POA: Diagnosis not present

## 2017-04-30 DIAGNOSIS — I10 Essential (primary) hypertension: Secondary | ICD-10-CM | POA: Diagnosis not present

## 2017-04-30 DIAGNOSIS — E559 Vitamin D deficiency, unspecified: Secondary | ICD-10-CM | POA: Diagnosis not present

## 2017-04-30 DIAGNOSIS — K219 Gastro-esophageal reflux disease without esophagitis: Secondary | ICD-10-CM | POA: Diagnosis not present

## 2017-04-30 DIAGNOSIS — Z86711 Personal history of pulmonary embolism: Secondary | ICD-10-CM | POA: Diagnosis not present

## 2017-05-05 DIAGNOSIS — F0281 Dementia in other diseases classified elsewhere with behavioral disturbance: Secondary | ICD-10-CM | POA: Diagnosis not present

## 2017-05-05 DIAGNOSIS — G309 Alzheimer's disease, unspecified: Secondary | ICD-10-CM | POA: Diagnosis not present

## 2017-05-26 DIAGNOSIS — R609 Edema, unspecified: Secondary | ICD-10-CM | POA: Diagnosis not present

## 2017-05-26 DIAGNOSIS — K219 Gastro-esophageal reflux disease without esophagitis: Secondary | ICD-10-CM | POA: Diagnosis not present

## 2017-05-26 DIAGNOSIS — Z86711 Personal history of pulmonary embolism: Secondary | ICD-10-CM | POA: Diagnosis not present

## 2017-05-27 DIAGNOSIS — M79674 Pain in right toe(s): Secondary | ICD-10-CM | POA: Diagnosis not present

## 2017-05-27 DIAGNOSIS — B351 Tinea unguium: Secondary | ICD-10-CM | POA: Diagnosis not present

## 2017-05-27 DIAGNOSIS — M79675 Pain in left toe(s): Secondary | ICD-10-CM | POA: Diagnosis not present

## 2017-06-02 DIAGNOSIS — S60221A Contusion of right hand, initial encounter: Secondary | ICD-10-CM | POA: Diagnosis not present

## 2017-06-02 DIAGNOSIS — G309 Alzheimer's disease, unspecified: Secondary | ICD-10-CM | POA: Diagnosis not present

## 2017-06-02 DIAGNOSIS — F0281 Dementia in other diseases classified elsewhere with behavioral disturbance: Secondary | ICD-10-CM | POA: Diagnosis not present

## 2017-06-04 DIAGNOSIS — Z9181 History of falling: Secondary | ICD-10-CM | POA: Diagnosis not present

## 2017-06-04 DIAGNOSIS — I10 Essential (primary) hypertension: Secondary | ICD-10-CM | POA: Diagnosis not present

## 2017-06-04 DIAGNOSIS — Z86711 Personal history of pulmonary embolism: Secondary | ICD-10-CM | POA: Diagnosis not present

## 2017-06-04 DIAGNOSIS — G309 Alzheimer's disease, unspecified: Secondary | ICD-10-CM | POA: Diagnosis not present

## 2017-06-04 DIAGNOSIS — M545 Low back pain: Secondary | ICD-10-CM | POA: Diagnosis not present

## 2017-06-04 DIAGNOSIS — Z7901 Long term (current) use of anticoagulants: Secondary | ICD-10-CM | POA: Diagnosis not present

## 2017-06-04 DIAGNOSIS — S61411D Laceration without foreign body of right hand, subsequent encounter: Secondary | ICD-10-CM | POA: Diagnosis not present

## 2017-06-04 DIAGNOSIS — W19XXXD Unspecified fall, subsequent encounter: Secondary | ICD-10-CM | POA: Diagnosis not present

## 2017-06-04 DIAGNOSIS — F0280 Dementia in other diseases classified elsewhere without behavioral disturbance: Secondary | ICD-10-CM | POA: Diagnosis not present

## 2017-06-05 ENCOUNTER — Other Ambulatory Visit: Payer: Self-pay

## 2017-06-05 ENCOUNTER — Emergency Department (HOSPITAL_COMMUNITY): Payer: PPO

## 2017-06-05 ENCOUNTER — Encounter (HOSPITAL_COMMUNITY): Payer: Self-pay | Admitting: Emergency Medicine

## 2017-06-05 ENCOUNTER — Emergency Department (HOSPITAL_COMMUNITY)
Admission: EM | Admit: 2017-06-05 | Discharge: 2017-06-06 | Disposition: A | Discharge status: Home / Self Care | Payer: PPO | Attending: Emergency Medicine | Admitting: Emergency Medicine

## 2017-06-05 DIAGNOSIS — Z7982 Long term (current) use of aspirin: Secondary | ICD-10-CM | POA: Insufficient documentation

## 2017-06-05 DIAGNOSIS — T148XXA Other injury of unspecified body region, initial encounter: Secondary | ICD-10-CM | POA: Diagnosis not present

## 2017-06-05 DIAGNOSIS — M545 Low back pain: Secondary | ICD-10-CM | POA: Diagnosis not present

## 2017-06-05 DIAGNOSIS — Z043 Encounter for examination and observation following other accident: Secondary | ICD-10-CM | POA: Diagnosis not present

## 2017-06-05 DIAGNOSIS — S3993XA Unspecified injury of pelvis, initial encounter: Secondary | ICD-10-CM | POA: Diagnosis not present

## 2017-06-05 DIAGNOSIS — W19XXXA Unspecified fall, initial encounter: Secondary | ICD-10-CM | POA: Diagnosis not present

## 2017-06-05 DIAGNOSIS — M549 Dorsalgia, unspecified: Secondary | ICD-10-CM | POA: Diagnosis not present

## 2017-06-05 DIAGNOSIS — S0990XA Unspecified injury of head, initial encounter: Secondary | ICD-10-CM | POA: Diagnosis not present

## 2017-06-05 DIAGNOSIS — Z79899 Other long term (current) drug therapy: Secondary | ICD-10-CM | POA: Diagnosis not present

## 2017-06-05 DIAGNOSIS — S299XXA Unspecified injury of thorax, initial encounter: Secondary | ICD-10-CM | POA: Diagnosis not present

## 2017-06-05 DIAGNOSIS — R443 Hallucinations, unspecified: Secondary | ICD-10-CM | POA: Diagnosis not present

## 2017-06-05 DIAGNOSIS — F0390 Unspecified dementia without behavioral disturbance: Secondary | ICD-10-CM | POA: Insufficient documentation

## 2017-06-05 DIAGNOSIS — M546 Pain in thoracic spine: Secondary | ICD-10-CM | POA: Diagnosis not present

## 2017-06-05 DIAGNOSIS — Z950 Presence of cardiac pacemaker: Secondary | ICD-10-CM | POA: Insufficient documentation

## 2017-06-05 DIAGNOSIS — Z8673 Personal history of transient ischemic attack (TIA), and cerebral infarction without residual deficits: Secondary | ICD-10-CM | POA: Diagnosis not present

## 2017-06-05 DIAGNOSIS — I10 Essential (primary) hypertension: Secondary | ICD-10-CM | POA: Insufficient documentation

## 2017-06-05 DIAGNOSIS — R4182 Altered mental status, unspecified: Secondary | ICD-10-CM | POA: Diagnosis not present

## 2017-06-05 DIAGNOSIS — S199XXA Unspecified injury of neck, initial encounter: Secondary | ICD-10-CM | POA: Diagnosis not present

## 2017-06-05 NOTE — ED Provider Notes (Signed)
[facility] EMERGENCY DEPARTMENT Provider Note   CSN: [id number] Arrival date & time: 06/05/17  2249     History   Chief Complaint Chief Complaint  Patient presents with  . Fall    HPI Theresa Woodard is a 84 y.o. female.  Level 5 caveat for dementia.  Patient from living facility with unwitnessed fall.  She was found on the floor in the supine position it was assumed she was try to go to the bathroom.  Patient does have a history of dementia, pacemaker, previous stroke.  She uses a cane to ambulate.  She denies any pain at this time but apparently told EMS she was having back pain.  She denies any head, neck, chest or abdominal pain.  She denies any shortness of breath.  She denies any dizziness or lightheadedness.  She does not remember falling.  The history is provided by the patient and the EMS personnel. The history is limited by the condition of the patient.  Fall     Past Medical History:  Diagnosis Date  . Chronic diarrhea   . Dementia   . Hypertension   . Osteoporosis   . Pacemaker   . Stroke [facility])     Patient Active Problem List   Diagnosis Date Noted  . Insomnia 01/10/2016  . Physical deconditioning 02/28/2015  . Dementia with behavioral disturbance 02/08/2015  . Fracture of multiple ribs 02/08/2015  . Thoracic compression fracture (HCC) 02/08/2015  . Cardiac pacemaker in situ 02/08/2015  . CVA, old, cognitive deficits 02/08/2015  . Hyperlipidemia 02/08/2015  . Essential hypertension, benign 02/08/2015    Past Surgical History:  Procedure Laterality Date  . ABDOMINAL HYSTERECTOMY    . APPENDECTOMY       OB History   None      Home Medications    Prior to Admission medications   Medication Sig Start Date End Date Taking? Authorizing Provider  acetaminophen (TYLENOL) 500 MG tablet Take 500 mg by mouth every 6 (six) hours as needed for moderate pain.    Yes [provider]  diltiazem (DILACOR XR) 120 MG 24 hr capsule Take 120 mg by  mouth daily.   Yes [provider]  divalproex (DEPAKOTE) 125 MG DR tablet Take 125 mg by mouth daily.   Yes [provider]  lisinopril (PRINIVIL,ZESTRIL) 20 MG tablet Take 20 mg by mouth daily.   Yes [provider]  Melatonin 3 MG TABS Take 1 tablet by mouth.   Yes [provider]  metoprolol succinate (TOPROL-XL) 25 MG 24 hr tablet Take 25 mg by mouth daily.   Yes [provider]  mineral oil-hydrophilic petrolatum (AQUAPHOR) ointment Apply topically as needed for dry skin. Apply topically to feet and legs twice daily x 14 days for dry skin. 01/10/16  Yes Ngetich, Dinah C, NP  Multiple Vitamin (MULTIVITAMIN WITH MINERALS) TABS tablet Take 1 tablet by mouth daily.   Yes [provider]  Psyllium (METAMUCIL PO) Take 4 capsules by mouth daily.   Yes [provider]  aspirin 81 MG tablet Take 81 mg by mouth daily.    [provider]  atorvastatin (LIPITOR) 20 MG tablet Take 20 mg by mouth daily.    [provider]  loperamide (IMODIUM) 2 MG capsule Take 2 mg by mouth 2 (two) times daily as needed for diarrhea or loose stools.    [provider]  UNABLE TO FIND Med Name: Prostat  30 mls by mouth twice daily  [provider]    Family History History reviewed. No pertinent family history.  Social History Social History   Tobacco Use  . Smoking status: Never Smoker  . Smokeless tobacco: Never Used  Substance Use Topics  . Alcohol use: No  . Drug use: No     Allergies   Ativan [lorazepam] and Haldol [haloperidol lactate]   Review of Systems Review of Systems  Unable to perform ROS: Dementia     Physical Exam Updated Vital Signs BP (!) 180/96   Pulse 71   Temp 98.3 F (36.8 C) (Oral)   Resp 20   Ht  (1.549 m)   Wt 47.2 kg (104 lb)   SpO2 99%   BMI 19.65 kg/m   Physical Exam  Constitutional: She appears well-developed and well-nourished. No distress.  Oriented to  person only.  HENT:  Head: Normocephalic and atraumatic.  Mouth/Throat: Oropharynx is clear and moist. No oropharyngeal exudate.  Eyes: Pupils are equal, round, and reactive to light. Conjunctivae and EOM are normal.  Neck: Normal range of motion. Neck supple.  No C-spine tenderness  Cardiovascular: Normal rate, normal heart sounds and intact distal pulses.  No murmur heard. Pacemaker left chest, irregular rhythm  Pulmonary/Chest: Effort normal and breath sounds normal. No respiratory distress. She exhibits no tenderness.  Abdominal: Soft. There is no tenderness. There is no rebound and no guarding.  Musculoskeletal: Normal range of motion. She exhibits no edema or tenderness.  No T or L-spine tenderness, no step-offs, full range of motion of hips without pain  Neurological: She is alert. No cranial nerve deficit. She exhibits normal muscle tone. Coordination normal.  Oriented x1, follows commands and moves all extremities, cranial nerves II to XII intact, no focal deficits  Skin: Skin is warm.  Psychiatric: She has a normal mood and affect. Her behavior is normal.  Nursing note and vitals reviewed.    ED Treatments / Results  Labs (all labs ordered are listed, but only abnormal results are displayed) Labs Reviewed  CBC WITH DIFFERENTIAL/PLATELET - Abnormal; Notable for the following components:      Result Value   RDW 17.9 (*)    Neutro Abs 8.0 (*)    All other components within normal limits  BASIC METABOLIC PANEL - Abnormal; Notable for the following components:   Glucose, Bld 113 (*)    BUN 24 (*)    All other components within normal limits    EKG EKG Interpretation  Date/Time:  Saturday Jun 05 2017 23:06:46 EDT Ventricular Rate:  68 PR Interval:    QRS Duration: 94 QT Interval:  404 QTC Calculation: 430 R Axis:   -52 Text Interpretation:  Sinus or ectopic atrial rhythm Supraventricular bigeminy Left anterior fascicular block Abnormal R-wave progression, early  transition LVH by voltage Nonspecific T abnormalities, inferior leads No previous ECGs available Confirmed by Glynn Octave [phone]) on 06/05/2017 11:39:11 PM   Radiology Dg Chest 2 View  Result Date: 06/06/2017 CLINICAL DATA:  Status post unwitnessed fall, with upper back pain. Hypertension and atrial fibrillation. EXAM: CHEST - 2 VIEW COMPARISON:  Chest radiograph and CTA of the chest performed 04/17/2016 FINDINGS: The lungs are hypoexpanded. Mild left basilar airspace opacity may reflect atelectasis or mild pneumonia. There is no evidence of pleural effusion or pneumothorax. The heart is borderline enlarged. A pacemaker is noted at the left chest wall, with leads ending at the right atrium and right ventricle. No acute osseous abnormalities are seen. There is chronic loss of  height at the lower thoracic spine. IMPRESSION: 1. Lungs hypoexpanded. Mild left basilar airspace opacity may reflect atelectasis or mild pneumonia. Borderline cardiomegaly. 2. Chronic loss of height of the lower thoracic spine. Electronically Signed   By: Roanna Raider M.D.   On: 06/06/2017 00:11   Dg Pelvis 1-2 Views  Result Date: 06/06/2017 CLINICAL DATA:  Status post unwitnessed fall, with lower back pain. Concern for pelvic injury. Initial encounter. EXAM: PELVIS - 1-2 VIEW COMPARISON:  None. FINDINGS: There is no evidence of fracture or dislocation. Both femoral heads are seated normally within their respective acetabula. No significant degenerative change is appreciated. Sclerosis is noted at the sacroiliac joints bilaterally. The visualized bowel gas pattern is grossly unremarkable in appearance. Scattered postoperative change is noted about the pelvis. Vascular calcifications are seen. IMPRESSION: No evidence of fracture or dislocation. Electronically Signed   By: Roanna Raider M.D.   On: 06/06/2017 00:11   Ct Head Wo Contrast  Result Date: 06/05/2017 CLINICAL DATA:  Status post unwitnessed fall. Concern for head or  cervical spine injury. Hallucinations. EXAM: CT HEAD WITHOUT CONTRAST CT CERVICAL SPINE WITHOUT CONTRAST TECHNIQUE: Multidetector CT imaging of the head and cervical spine was performed following the standard protocol without intravenous contrast. Multiplanar CT image reconstructions of the cervical spine were also generated. COMPARISON:  CT of the head and cervical spine performed 04/16/2016 FINDINGS: CT HEAD FINDINGS Brain: No evidence of acute infarction, hemorrhage, hydrocephalus, extra-axial collection or mass lesion / mass effect. Prominence of the ventricles and sulci reflects moderately severe cortical volume loss. Cerebellar atrophy is noted. Scattered periventricular and subcortical white matter change likely reflects small vessel ischemic microangiopathy. A chronic lacunar infarct is noted at the left basal ganglia. The brainstem and fourth ventricle are within normal limits. The basal ganglia are unremarkable in appearance. The cerebral hemispheres demonstrate grossly normal gray-white differentiation. No mass effect or midline shift is seen. Vascular: No hyperdense vessel or unexpected calcification. Skull: There is no evidence of fracture; visualized osseous structures are unremarkable in appearance. Sinuses/Orbits: The visualized portions of the orbits are within normal limits. The paranasal sinuses and mastoid air cells are well-aerated. Other: No significant soft tissue abnormalities are seen. CT CERVICAL SPINE FINDINGS Alignment: There is grade 1 anterolisthesis of C3 on C4 and of C4 on C5, with underlying facet disease. Skull base and vertebrae: No acute fracture. No primary bone lesion or focal pathologic process. Soft tissues and spinal canal: No prevertebral fluid or swelling. No visible canal hematoma. Disc levels: Multilevel disc space narrowing is noted along the cervical spine, with scattered anterior and posterior disc osteophyte complexes. Upper chest: The visualized lung apices are  clear. The thyroid gland is unremarkable. Dense calcification is seen at the carotid bifurcations bilaterally, with likely mild luminal narrowing bilaterally. Other: No additional soft tissue abnormalities are seen. IMPRESSION: 1. No evidence of traumatic intracranial injury or fracture. 2. No evidence of acute fracture or subluxation along the cervical spine. 3. Moderately severe cortical volume loss and scattered small vessel ischemic microangiopathy. 4. Chronic lacunar infarct at the left basal ganglia. 5. Degenerative change noted along the cervical spine. 6. Dense calcification at the carotid bifurcations bilaterally, with likely mild luminal narrowing bilaterally. Carotid ultrasound would be helpful for further evaluation, when and as deemed clinically appropriate. Electronically Signed   By: Roanna Raider M.D.   On: 06/05/2017 23:51   Ct Cervical Spine Wo Contrast  Result Date: 06/05/2017 CLINICAL DATA:  Status post unwitnessed fall. Concern for head or  cervical spine injury. Hallucinations. EXAM: CT HEAD WITHOUT CONTRAST CT CERVICAL SPINE WITHOUT CONTRAST TECHNIQUE: Multidetector CT imaging of the head and cervical spine was performed following the standard protocol without intravenous contrast. Multiplanar CT image reconstructions of the cervical spine were also generated. COMPARISON:  CT of the head and cervical spine performed 04/16/2016 FINDINGS: CT HEAD FINDINGS Brain: No evidence of acute infarction, hemorrhage, hydrocephalus, extra-axial collection or mass lesion / mass effect. Prominence of the ventricles and sulci reflects moderately severe cortical volume loss. Cerebellar atrophy is noted. Scattered periventricular and subcortical white matter change likely reflects small vessel ischemic microangiopathy. A chronic lacunar infarct is noted at the left basal ganglia. The brainstem and fourth ventricle are within normal limits. The basal ganglia are unremarkable in appearance. The cerebral  hemispheres demonstrate grossly normal gray-white differentiation. No mass effect or midline shift is seen. Vascular: No hyperdense vessel or unexpected calcification. Skull: There is no evidence of fracture; visualized osseous structures are unremarkable in appearance. Sinuses/Orbits: The visualized portions of the orbits are within normal limits. The paranasal sinuses and mastoid air cells are well-aerated. Other: No significant soft tissue abnormalities are seen. CT CERVICAL SPINE FINDINGS Alignment: There is grade 1 anterolisthesis of C3 on C4 and of C4 on C5, with underlying facet disease. Skull base and vertebrae: No acute fracture. No primary bone lesion or focal pathologic process. Soft tissues and spinal canal: No prevertebral fluid or swelling. No visible canal hematoma. Disc levels: Multilevel disc space narrowing is noted along the cervical spine, with scattered anterior and posterior disc osteophyte complexes. Upper chest: The visualized lung apices are clear. The thyroid gland is unremarkable. Dense calcification is seen at the carotid bifurcations bilaterally, with likely mild luminal narrowing bilaterally. Other: No additional soft tissue abnormalities are seen. IMPRESSION: 1. No evidence of traumatic intracranial injury or fracture. 2. No evidence of acute fracture or subluxation along the cervical spine. 3. Moderately severe cortical volume loss and scattered small vessel ischemic microangiopathy. 4. Chronic lacunar infarct at the left basal ganglia. 5. Degenerative change noted along the cervical spine. 6. Dense calcification at the carotid bifurcations bilaterally, with likely mild luminal narrowing bilaterally. Carotid ultrasound would be helpful for further evaluation, when and as deemed clinically appropriate. Electronically Signed   By: Roanna Raider M.D.   On: 06/05/2017 23:51    Procedures Procedures (including critical care time)  Medications Ordered in ED Medications - No data to  display   Initial Impression / Assessment and Plan / ED Course  I have reviewed the triage vital signs and the nursing notes.  Pertinent labs & imaging results that were available during my care of the patient were reviewed by me and considered in my medical decision making (see chart for details).     Patient from living facility after unwitnessed fall.  Denies any pain complaint.  Patient hemodynamically stable.  She denies any head, neck, back, chest or abdominal pain.  EKG obtained shows sinus rhythm.  CT head and C-spine are negative for acute rheumatic pathology.  Chest x-ray raises question of pneumonia though patient has had no cough or fever.  Suspect is atelectasis. Pelvic x-ray is negative.  On attempted ambulation, patient became aggressive and belligerent.  She is moving all extremities. This is likely secondary to her dementia.  On initial evaluation patient did not have any pain with range of motion of her upper or lower extremities.  She appears stable to return to her facility.  Return precautions discussed. Final Clinical  Impressions(s) / ED Diagnoses   Final diagnoses:  Fall, initial encounter    ED Discharge Orders    None       Altonio Schwertner, Jeannett Senior, MD 06/06/17 [phone]

## 2017-06-05 NOTE — ED Triage Notes (Signed)
Unwitness fall, dementia and hallucinations, blood pressure elevated and afib.  Complaints of back pain.

## 2017-06-06 LAB — CBC WITH DIFFERENTIAL/PLATELET
BASOS ABS: 0 10*3/uL (ref 0.0–0.1)
BASOS PCT: 0 %
Eosinophils Absolute: 0.1 10*3/uL (ref 0.0–0.7)
Eosinophils Relative: 1 %
HEMATOCRIT: 40 % (ref 36.0–46.0)
HEMOGLOBIN: 12.6 g/dL (ref 12.0–15.0)
LYMPHS PCT: 8 %
Lymphs Abs: 0.8 10*3/uL (ref 0.7–4.0)
MCH: 29.4 pg (ref 26.0–34.0)
MCHC: 31.5 g/dL (ref 30.0–36.0)
MCV: 93.2 fL (ref 78.0–100.0)
Monocytes Absolute: 0.9 10*3/uL (ref 0.1–1.0)
Monocytes Relative: 9 %
NEUTROS ABS: 8 10*3/uL — AB (ref 1.7–7.7)
NEUTROS PCT: 82 %
Platelets: 196 10*3/uL (ref 150–400)
RBC: 4.29 MIL/uL (ref 3.87–5.11)
RDW: 17.9 % — ABNORMAL HIGH (ref 11.5–15.5)
WBC: 9.7 10*3/uL (ref 4.0–10.5)

## 2017-06-06 LAB — BASIC METABOLIC PANEL
ANION GAP: 9 (ref 5–15)
BUN: 24 mg/dL — ABNORMAL HIGH (ref 6–20)
CALCIUM: 9 mg/dL (ref 8.9–10.3)
CHLORIDE: 104 mmol/L (ref 101–111)
CO2: 26 mmol/L (ref 22–32)
Creatinine, Ser: 0.82 mg/dL (ref 0.44–1.00)
GFR calc non Af Amer: >60 mL/min (ref >60)
Glucose, Bld: 113 mg/dL — ABNORMAL HIGH (ref 65–99)
POTASSIUM: 4.5 mmol/L (ref 3.5–5.1)
Sodium: 139 mmol/L (ref 135–145)

## 2017-06-06 NOTE — ED Notes (Signed)
Attempt to ambulate patient with walker, unsuccessful, patient unable to stand without sliding on floor.

## 2017-06-06 NOTE — ED Notes (Signed)
Patient asleep at this time.

## 2017-06-06 NOTE — ED Notes (Signed)
Patient awaken by EDP. Patient yelling and screaming at this time.

## 2017-06-06 NOTE — Discharge Instructions (Addendum)
Imaging is negative for serious traumatic injury.  Follow-up with your doctor.  Return to the ED if you develop new or worsening symptoms.

## 2017-06-07 DIAGNOSIS — M79641 Pain in right hand: Secondary | ICD-10-CM | POA: Diagnosis not present

## 2017-06-07 DIAGNOSIS — M545 Low back pain: Secondary | ICD-10-CM | POA: Diagnosis not present

## 2017-06-07 DIAGNOSIS — M546 Pain in thoracic spine: Secondary | ICD-10-CM | POA: Diagnosis not present

## 2017-06-09 DIAGNOSIS — R296 Repeated falls: Secondary | ICD-10-CM | POA: Diagnosis not present

## 2017-06-11 ENCOUNTER — Encounter (HOSPITAL_COMMUNITY): Payer: Self-pay

## 2017-06-11 ENCOUNTER — Emergency Department (HOSPITAL_COMMUNITY): Payer: PPO

## 2017-06-11 ENCOUNTER — Inpatient Hospital Stay (HOSPITAL_COMMUNITY)
Admission: EM | Admit: 2017-06-11 | Discharge: 2017-06-19 | DRG: 175 | Disposition: E | Payer: PPO | Attending: Internal Medicine | Admitting: Internal Medicine

## 2017-06-11 DIAGNOSIS — Z993 Dependence on wheelchair: Secondary | ICD-10-CM | POA: Diagnosis not present

## 2017-06-11 DIAGNOSIS — M810 Age-related osteoporosis without current pathological fracture: Secondary | ICD-10-CM | POA: Diagnosis not present

## 2017-06-11 DIAGNOSIS — I69319 Unspecified symptoms and signs involving cognitive functions following cerebral infarction: Secondary | ICD-10-CM | POA: Diagnosis not present

## 2017-06-11 DIAGNOSIS — Z79899 Other long term (current) drug therapy: Secondary | ICD-10-CM | POA: Diagnosis not present

## 2017-06-11 DIAGNOSIS — R32 Unspecified urinary incontinence: Secondary | ICD-10-CM | POA: Diagnosis present

## 2017-06-11 DIAGNOSIS — I10 Essential (primary) hypertension: Secondary | ICD-10-CM | POA: Diagnosis not present

## 2017-06-11 DIAGNOSIS — Z90710 Acquired absence of both cervix and uterus: Secondary | ICD-10-CM

## 2017-06-11 DIAGNOSIS — Z515 Encounter for palliative care: Secondary | ICD-10-CM | POA: Diagnosis not present

## 2017-06-11 DIAGNOSIS — W19XXXA Unspecified fall, initial encounter: Secondary | ICD-10-CM | POA: Diagnosis present

## 2017-06-11 DIAGNOSIS — R52 Pain, unspecified: Secondary | ICD-10-CM | POA: Diagnosis not present

## 2017-06-11 DIAGNOSIS — I2782 Chronic pulmonary embolism: Secondary | ICD-10-CM | POA: Diagnosis not present

## 2017-06-11 DIAGNOSIS — Z888 Allergy status to other drugs, medicaments and biological substances status: Secondary | ICD-10-CM

## 2017-06-11 DIAGNOSIS — J9601 Acute respiratory failure with hypoxia: Secondary | ICD-10-CM | POA: Diagnosis not present

## 2017-06-11 DIAGNOSIS — F03918 Unspecified dementia, unspecified severity, with other behavioral disturbance: Secondary | ICD-10-CM | POA: Diagnosis present

## 2017-06-11 DIAGNOSIS — I959 Hypotension, unspecified: Secondary | ICD-10-CM | POA: Diagnosis not present

## 2017-06-11 DIAGNOSIS — F0391 Unspecified dementia with behavioral disturbance: Secondary | ICD-10-CM | POA: Diagnosis not present

## 2017-06-11 DIAGNOSIS — Z9049 Acquired absence of other specified parts of digestive tract: Secondary | ICD-10-CM

## 2017-06-11 DIAGNOSIS — Z8781 Personal history of (healed) traumatic fracture: Secondary | ICD-10-CM

## 2017-06-11 DIAGNOSIS — Z7189 Other specified counseling: Secondary | ICD-10-CM | POA: Diagnosis not present

## 2017-06-11 DIAGNOSIS — R296 Repeated falls: Secondary | ICD-10-CM | POA: Diagnosis present

## 2017-06-11 DIAGNOSIS — S3992XA Unspecified injury of lower back, initial encounter: Secondary | ICD-10-CM | POA: Diagnosis not present

## 2017-06-11 DIAGNOSIS — I712 Thoracic aortic aneurysm, without rupture: Secondary | ICD-10-CM | POA: Diagnosis not present

## 2017-06-11 DIAGNOSIS — R748 Abnormal levels of other serum enzymes: Secondary | ICD-10-CM | POA: Diagnosis not present

## 2017-06-11 DIAGNOSIS — R410 Disorientation, unspecified: Secondary | ICD-10-CM | POA: Diagnosis not present

## 2017-06-11 DIAGNOSIS — S299XXA Unspecified injury of thorax, initial encounter: Secondary | ICD-10-CM | POA: Diagnosis not present

## 2017-06-11 DIAGNOSIS — I2699 Other pulmonary embolism without acute cor pulmonale: Principal | ICD-10-CM | POA: Diagnosis present

## 2017-06-11 DIAGNOSIS — Z86711 Personal history of pulmonary embolism: Secondary | ICD-10-CM | POA: Diagnosis present

## 2017-06-11 DIAGNOSIS — I82403 Acute embolism and thrombosis of unspecified deep veins of lower extremity, bilateral: Secondary | ICD-10-CM

## 2017-06-11 DIAGNOSIS — Z66 Do not resuscitate: Secondary | ICD-10-CM | POA: Diagnosis not present

## 2017-06-11 DIAGNOSIS — R531 Weakness: Secondary | ICD-10-CM

## 2017-06-11 DIAGNOSIS — F419 Anxiety disorder, unspecified: Secondary | ICD-10-CM | POA: Diagnosis not present

## 2017-06-11 DIAGNOSIS — K529 Noninfective gastroenteritis and colitis, unspecified: Secondary | ICD-10-CM | POA: Diagnosis present

## 2017-06-11 DIAGNOSIS — M546 Pain in thoracic spine: Secondary | ICD-10-CM | POA: Diagnosis not present

## 2017-06-11 DIAGNOSIS — Z7901 Long term (current) use of anticoagulants: Secondary | ICD-10-CM | POA: Diagnosis not present

## 2017-06-11 DIAGNOSIS — R270 Ataxia, unspecified: Secondary | ICD-10-CM | POA: Diagnosis not present

## 2017-06-11 DIAGNOSIS — Z950 Presence of cardiac pacemaker: Secondary | ICD-10-CM | POA: Diagnosis not present

## 2017-06-11 DIAGNOSIS — I340 Nonrheumatic mitral (valve) insufficiency: Secondary | ICD-10-CM | POA: Diagnosis not present

## 2017-06-11 DIAGNOSIS — I82409 Acute embolism and thrombosis of unspecified deep veins of unspecified lower extremity: Secondary | ICD-10-CM

## 2017-06-11 DIAGNOSIS — F0151 Vascular dementia with behavioral disturbance: Secondary | ICD-10-CM | POA: Diagnosis not present

## 2017-06-11 DIAGNOSIS — S3991XA Unspecified injury of abdomen, initial encounter: Secondary | ICD-10-CM | POA: Diagnosis not present

## 2017-06-11 HISTORY — DX: Other pulmonary embolism without acute cor pulmonale: I26.99

## 2017-06-11 HISTORY — DX: Unspecified fracture of sternum, initial encounter for closed fracture: S22.20XA

## 2017-06-11 HISTORY — DX: Fracture of one rib, unspecified side, initial encounter for closed fracture: S22.39XA

## 2017-06-11 HISTORY — DX: Personal history of (healed) traumatic fracture: Z87.81

## 2017-06-11 LAB — TROPONIN I
Troponin I: <0.03 ng/mL (ref <0.03)
Troponin I: 0.04 ng/mL (ref <0.03)

## 2017-06-11 LAB — COMPREHENSIVE METABOLIC PANEL
ALBUMIN: 3 g/dL — AB (ref 3.5–5.0)
ALK PHOS: 77 U/L (ref 38–126)
ALT: 12 U/L — AB (ref 14–54)
AST: 22 U/L (ref 15–41)
Anion gap: 7 (ref 5–15)
BILIRUBIN TOTAL: 0.5 mg/dL (ref 0.3–1.2)
BUN: 19 mg/dL (ref 6–20)
CALCIUM: 8.5 mg/dL — AB (ref 8.9–10.3)
CO2: 26 mmol/L (ref 22–32)
Chloride: 102 mmol/L (ref 101–111)
Creatinine, Ser: 0.93 mg/dL (ref 0.44–1.00)
GFR calc Af Amer: >60 mL/min (ref >60)
GFR calc non Af Amer: 53 mL/min — ABNORMAL LOW (ref >60)
GLUCOSE: 95 mg/dL (ref 65–99)
POTASSIUM: 4.5 mmol/L (ref 3.5–5.1)
Sodium: 135 mmol/L (ref 135–145)
Total Protein: 6.1 g/dL — ABNORMAL LOW (ref 6.5–8.1)

## 2017-06-11 LAB — URINALYSIS, ROUTINE W REFLEX MICROSCOPIC
Bilirubin Urine: NEGATIVE
GLUCOSE, UA: NEGATIVE mg/dL
HGB URINE DIPSTICK: NEGATIVE
Ketones, ur: NEGATIVE mg/dL
Leukocytes, UA: NEGATIVE
Nitrite: NEGATIVE
PROTEIN: NEGATIVE mg/dL
Specific Gravity, Urine: 1.013 (ref 1.005–1.030)
pH: 5 (ref 5.0–8.0)

## 2017-06-11 LAB — CBC WITH DIFFERENTIAL/PLATELET
BASOS ABS: 0 10*3/uL (ref 0.0–0.1)
BASOS PCT: 0 %
Eosinophils Absolute: 0.1 10*3/uL (ref 0.0–0.7)
Eosinophils Relative: 1 %
HEMATOCRIT: 38.5 % (ref 36.0–46.0)
HEMOGLOBIN: 12.1 g/dL (ref 12.0–15.0)
Lymphocytes Relative: 15 %
Lymphs Abs: 1.1 10*3/uL (ref 0.7–4.0)
MCH: 29.8 pg (ref 26.0–34.0)
MCHC: 31.4 g/dL (ref 30.0–36.0)
MCV: 94.8 fL (ref 78.0–100.0)
Monocytes Absolute: 0.8 10*3/uL (ref 0.1–1.0)
Monocytes Relative: 11 %
NEUTROS ABS: 5.3 10*3/uL (ref 1.7–7.7)
NEUTROS PCT: 73 %
Platelets: 189 10*3/uL (ref 150–400)
RBC: 4.06 MIL/uL (ref 3.87–5.11)
RDW: 17.8 % — ABNORMAL HIGH (ref 11.5–15.5)
WBC: 7.3 10*3/uL (ref 4.0–10.5)

## 2017-06-11 LAB — GLUCOSE, CAPILLARY: GLUCOSE-CAPILLARY: 98 mg/dL (ref 65–99)

## 2017-06-11 LAB — PROTIME-INR
INR: 1.4
Prothrombin Time: 17.1 seconds — ABNORMAL HIGH (ref 11.4–15.2)

## 2017-06-11 LAB — APTT: aPTT: 35 seconds (ref 24–36)

## 2017-06-11 LAB — MRSA PCR SCREENING: MRSA BY PCR: NEGATIVE

## 2017-06-11 LAB — HEPARIN LEVEL (UNFRACTIONATED): HEPARIN UNFRACTIONATED: 1.87 [IU]/mL — AB (ref 0.30–0.70)

## 2017-06-11 MED ORDER — ACETAMINOPHEN 650 MG RE SUPP: 650.0000 mg | Freq: Four times a day (QID) | RECTAL | Status: DC | PRN

## 2017-06-11 MED ORDER — HEPARIN BOLUS VIA INFUSION
2000.0000 [IU] | Freq: Once | INTRAVENOUS | Status: AC
  Administered 2017-06-11: 2000 [IU] via INTRAVENOUS

## 2017-06-11 MED ORDER — DIVALPROEX SODIUM 125 MG PO DR TAB
125.0000 mg | DELAYED_RELEASE_TABLET | Freq: Every day | ORAL | Status: DC
  Administered 2017-06-12 – 2017-06-14 (×3): 125 mg via ORAL
  Filled 2017-06-11 (×6): qty 1

## 2017-06-11 MED ORDER — LORAZEPAM 2 MG/ML IJ SOLN
0.5000 mg | INTRAMUSCULAR | Status: DC | PRN
  Administered 2017-06-12 – 2017-06-14 (×11): 0.5 mg via INTRAVENOUS
  Filled 2017-06-11 (×11): qty 1

## 2017-06-11 MED ORDER — LACTATED RINGERS IV BOLUS
1000.0000 mL | Freq: Once | INTRAVENOUS | Status: AC
  Administered 2017-06-11: 1000 mL via INTRAVENOUS

## 2017-06-11 MED ORDER — HEPARIN (PORCINE) IN NACL 100-0.45 UNIT/ML-% IJ SOLN
700.0000 [IU]/h | INTRAMUSCULAR | Status: DC
  Administered 2017-06-11: 750 [IU]/h via INTRAVENOUS
  Administered 2017-06-13 – 2017-06-14 (×2): 650 [IU]/h via INTRAVENOUS
  Filled 2017-06-11 (×3): qty 250

## 2017-06-11 MED ORDER — AQUAPHOR EX OINT
TOPICAL_OINTMENT | CUTANEOUS | Status: DC | PRN
  Filled 2017-06-11: qty 50

## 2017-06-11 MED ORDER — IOPAMIDOL (ISOVUE-370) INJECTION 76%
80.0000 mL | Freq: Once | INTRAVENOUS | Status: AC | PRN
  Administered 2017-06-11: 80 mL via INTRAVENOUS

## 2017-06-11 MED ORDER — LORAZEPAM 2 MG/ML IJ SOLN
INTRAMUSCULAR | Status: AC
  Administered 2017-06-11: 0.5 mg via INTRAVENOUS
  Filled 2017-06-11: qty 1

## 2017-06-11 MED ORDER — LORAZEPAM 2 MG/ML IJ SOLN
0.5000 mg | Freq: Once | INTRAMUSCULAR | Status: AC
  Administered 2017-06-11: 0.5 mg via INTRAVENOUS

## 2017-06-11 MED ORDER — ADULT MULTIVITAMIN W/MINERALS CH
1.0000 | ORAL_TABLET | Freq: Every day | ORAL | Status: DC
  Administered 2017-06-12 – 2017-06-14 (×2): 1 via ORAL
  Filled 2017-06-11 (×4): qty 1

## 2017-06-11 MED ORDER — ENSURE ENLIVE PO LIQD
237.0000 mL | Freq: Every day | ORAL | Status: DC
  Administered 2017-06-12 – 2017-06-14 (×3): 237 mL via ORAL

## 2017-06-11 MED ORDER — METOPROLOL SUCCINATE ER 25 MG PO TB24
25.0000 mg | ORAL_TABLET | Freq: Every day | ORAL | Status: DC
  Administered 2017-06-12 – 2017-06-13 (×2): 25 mg via ORAL
  Filled 2017-06-11 (×2): qty 1

## 2017-06-11 MED ORDER — ONDANSETRON HCL 4 MG/2ML IJ SOLN: 4.0000 mg | Freq: Four times a day (QID) | INTRAMUSCULAR | Status: DC | PRN

## 2017-06-11 MED ORDER — LOPERAMIDE HCL 2 MG PO CAPS: 2.0000 mg | ORAL_CAPSULE | Freq: Two times a day (BID) | ORAL | Status: DC | PRN

## 2017-06-11 MED ORDER — ACETAMINOPHEN 325 MG PO TABS: 650.0000 mg | ORAL_TABLET | Freq: Four times a day (QID) | ORAL | Status: DC | PRN

## 2017-06-11 MED ORDER — SODIUM CHLORIDE 0.9 % IV SOLN
INTRAVENOUS | Status: DC
  Administered 2017-06-11 – 2017-06-14 (×7): via INTRAVENOUS

## 2017-06-11 MED ORDER — MELATONIN 5 MG PO TABS
1.0000 | ORAL_TABLET | Freq: Every day | ORAL | Status: DC
  Filled 2017-06-11 (×3): qty 1

## 2017-06-11 MED ORDER — LISINOPRIL 10 MG PO TABS
20.0000 mg | ORAL_TABLET | Freq: Every day | ORAL | Status: DC
  Administered 2017-06-12 – 2017-06-14 (×3): 20 mg via ORAL
  Filled 2017-06-11 (×4): qty 2

## 2017-06-11 MED ORDER — ONDANSETRON HCL 4 MG PO TABS: 4.0000 mg | ORAL_TABLET | Freq: Four times a day (QID) | ORAL | Status: DC | PRN

## 2017-06-11 NOTE — ED Provider Notes (Signed)
Emergency Department Provider Note   I have reviewed the triage vital signs and the nursing notes.   HISTORY  Chief Complaint Fall   HPI Theresa Woodard is a 84 y.o. female with multiple medical problems as documented below the presents to the emergency department from assisted living facility with her husband who provides the history.  Patient's husband states that she is fallen 4 times in the last 6 days which is not her normal.  States she has been complaining of back pain during that time as well.  She is gotten more weak and has difficulty ambulating.  He does not think that the difficulty ambulating is related to pain but is generalized weakness.  He has not noticed any focal weakness.  Patient is demented and cannot offer any supplemental history. No other associated or modifying symptoms.   Level V Caveat 2/2 Dementia  Past Medical History:  Diagnosis Date  . Chronic diarrhea   . Dementia   . Fracture of one rib, unsp side, init for clos fx   . History of vertebral fracture    lumbar region, after fall  . Hypertension   . Osteoporosis   . Pacemaker   . Pulmonary embolus (HCC)   . Sternum fx   . Stroke [facility])     Patient Active Problem List   Diagnosis Date Noted  . Pulmonary embolism, bilateral (HCC) 2017-06-18  . History of pulmonary embolism 06/18/17  . Pulmonary embolus (HCC)   . Insomnia 01/10/2016  . Physical deconditioning 02/28/2015  . Dementia with behavioral disturbance 02/08/2015  . Fracture of multiple ribs 02/08/2015  . Thoracic compression fracture (HCC) 02/08/2015  . Cardiac pacemaker in situ 02/08/2015  . CVA, old, cognitive deficits 02/08/2015  . Hyperlipidemia 02/08/2015  . Essential hypertension, benign 02/08/2015    Past Surgical History:  Procedure Laterality Date  . ABDOMINAL HYSTERECTOMY    . APPENDECTOMY    . PACEMAKER IMPLANT        Allergies Ativan [lorazepam] and Haldol [haloperidol lactate]  No family history on  file.  Social History Social History   Tobacco Use  . Smoking status: Never Smoker  . Smokeless tobacco: Never Used  Substance Use Topics  . Alcohol use: No  . Drug use: No    Review of Systems  Level V Caveat 2/2 Dementia ____________________________________________   PHYSICAL EXAM:  VITAL SIGNS: ED Triage Vitals  Enc Vitals Group     BP 06-18-2017 1042 (!) 153/77     Pulse Rate 2017-06-18 1042 60     Resp 2017-06-18 1042 18     Temp 2017-06-18 1042 98.1 F (36.7 C)     Temp Source 06-18-2017 1042 Oral     SpO2 06/18/17 1042 96 %     Weight 2017/06/18 1040 104 lb (47.2 kg)    Constitutional: Alert and confused. Well appearing and in no acute distress. Eyes: Conjunctivae are normal. PERRL. EOMI. Head: Atraumatic. Nose: No congestion/rhinnorhea. Mouth/Throat: Mucous membranes are moist.  Oropharynx non-erythematous. Neck: No stridor.  No meningeal signs.   Cardiovascular: Normal rate, regular rhythm. Good peripheral circulation. Grossly normal heart sounds.   Respiratory: Normal respiratory effort.  No retractions. Lungs CTAB. Gastrointestinal: Soft and ttp worse in suprapubic area. No distention.  Musculoskeletal: No lower extremity tenderness nor edema. No gross deformities of extremities. Minimal ttp in middle back (low T/high Lspine area) but also with bilateral CVA ttp. Also with TTP over right hip Neurologic:  Normal speech and language. No gross focal  neurologic deficits are appreciated. Moving all extremities.  Skin:  Skin is warm, dry and intact. No rash noted.  ____________________________________________   LABS (all labs ordered are listed, but only abnormal results are displayed)  Labs Reviewed  CBC WITH DIFFERENTIAL/PLATELET - Abnormal; Notable for the following components:      Result Value   RDW 17.8 (*)    All other components within normal limits  COMPREHENSIVE METABOLIC PANEL - Abnormal; Notable for the following components:   Calcium 8.5 (*)    Total  Protein 6.1 (*)    Albumin 3.0 (*)    ALT 12 (*)    GFR calc non Af Amer 53 (*)    All other components within normal limits  HEPARIN LEVEL (UNFRACTIONATED) - Abnormal; Notable for the following components:   Heparin Unfractionated 1.87 (*)    All other components within normal limits  PROTIME-INR - Abnormal; Notable for the following components:   Prothrombin Time 17.1 (*)    All other components within normal limits  TROPONIN I - Abnormal; Notable for the following components:   Troponin I 0.04 (*)    All other components within normal limits  MRSA PCR SCREENING  URINALYSIS, ROUTINE W REFLEX MICROSCOPIC  TROPONIN I  APTT  CBC  TROPONIN I  TROPONIN I  BASIC METABOLIC PANEL  PROTIME-INR  APTT   ____________________________________________  EKG   EKG Interpretation  Date/Time:  Friday Jun 11 2017 13:12:39 EDT Ventricular Rate:  64 PR Interval:    QRS Duration: 94 QT Interval:  452 QTC Calculation: 429 R Axis:   -51 Text Interpretation:  Ventricular-paced complexes No further rhythm analysis attempted due to paced rhythm Left anterior fascicular block Borderline T abnormalities, anterior leads Baseline wander in lead(s) V4 No significant change since last tracing Confirmed by Marily Memos [phone]) on 05/28/2017 1:35:44 PM       ____________________________________________  RADIOLOGY  Ct Head Wo Contrast  Result Date: 06/12/2017 CLINICAL DATA:  Ataxia, multiple falls. EXAM: CT HEAD WITHOUT CONTRAST TECHNIQUE: Contiguous axial images were obtained from the base of the skull through the vertex without intravenous contrast. COMPARISON:  CT scan of Jun 05, 2017. FINDINGS: Brain: Stable diffuse cortical atrophy is noted. Mild chronic ischemic white matter disease is noted. No mass effect or midline shift is noted. Ventricular size is within normal limits. There is no evidence of mass lesion, hemorrhage or acute infarction. Vascular: No hyperdense vessel or unexpected  calcification. Skull: Normal. Negative for fracture or focal lesion. Sinuses/Orbits: No acute finding. Other: None. IMPRESSION: Stable diffuse cortical atrophy. Mild chronic ischemic white matter disease. No acute intracranial abnormality seen. Electronically Signed   By: Lupita Raider, M.D.   On: 05/28/2017 16:41   Ct Angio Chest Pe W And/or Wo Contrast  Result Date: 06/05/2017 CLINICAL DATA:  fall last night. Staff reported pt complained of back pain but denies pain now. Husband reports 4 falls in 6 days and has been weak EXAM: CT ANGIOGRAPHY CHEST WITH CONTRAST TECHNIQUE: Multidetector CT imaging of the chest was performed using the standard protocol during bolus administration of intravenous contrast. Multiplanar CT image reconstructions and MIPs were obtained to evaluate the vascular anatomy. CONTRAST:  80mL ISOVUE-370 IOPAMIDOL (ISOVUE-370) INJECTION 76% COMPARISON:  04/17/2016 FINDINGS: Cardiovascular: Moderate cardiomegaly. No pericardial effusion. Right ventricular dilatation. RV/LV ratio 1.5. Dilated central pulmonary arteries, present since 01/31/2015. There is a partially large central filling defect at the bifurcation of the left pulmonary artery. Partially occlusive embolus in left lower lobe branch.  Possible nonocclusive thrombus in a posterior right lower lobe branch although motion degrades images through the lung bases. Dilatation of the ascending aorta up to 4.3 cm diameter. There is incomplete opacification of the arch and descending thoracic aorta, limiting assessment for aortic dissection. Scattered coarse calcified plaque. Mediastinum/Nodes: No hilar or mediastinal adenopathy. Lungs/Pleura: Trace right pleural effusion. No pneumothorax. Minimal dependent atelectasis posteriorly in both lung bases. Lungs otherwise clear. Upper Abdomen: No acute findings Musculoskeletal: Interval T12 vertebral body cement augmentation. Stable compression deformities of T6, T7 and T11. Old healed sternal  fracture. No acute fracture. Review of the MIP images confirms the above findings. IMPRESSION: 1. Positive for bilateral acute pulmonary emboli, left greater than right. CT evidence of right heart strain (RV/LV Ratio = 1.5) suggesting at least submassive (intermediate risk) PE. The presence of right heart strain has been associated with an increased risk of morbidity and mortality. Please activate Code PE by paging [phone]. Critical Value/emergent results were called by telephone at the time of interpretation on 21-Jun-2017 at 4:51 pm to Dr. Marily Memos , who verbally acknowledged these results. 2. 4.3 cm ascending aortic aneurysm. Recommend annual imaging followup by CTA or MRA. This recommendation follows 2010 ACCF/AHA/AATS/ACR/ASA/SCA/SCAI/SIR/STS/SVM Guidelines for the Diagnosis and Management of Patients with Thoracic Aortic Disease. Circulation. 2010; 121: U981-X914 Electronically Signed   By: Corlis Leak M.D.   On: June 21, 2017 16:52   Ct Abdomen Pelvis W Contrast  Result Date: 06-21-2017 CLINICAL DATA:  Per ED notes: Pt brought in by EMS from [facility] due to fall last night. Staff reported pt complained of back pain but denies pain now. Husband reports 4 falls in 6 days and has been weak EXAM: CT ABDOMEN AND PELVIS WITH CONTRAST TECHNIQUE: Multidetector CT imaging of the abdomen and pelvis was performed using the standard protocol following bolus administration of intravenous contrast. CONTRAST:  80mL ISOVUE-370 IOPAMIDOL (ISOVUE-370) INJECTION 76% COMPARISON:  Chest CTA, 04/17/2016 FINDINGS: Lower chest: See current chest CT. Hepatobiliary: No focal liver abnormality is seen. No gallstones, gallbladder wall thickening, or biliary dilatation. Pancreas: Unremarkable. No pancreatic ductal dilatation or surrounding inflammatory changes. Spleen: Normal size spleen. Heterogeneous early enhancement that becomes more homogeneous on the delayed image. No convincing splenic mass. Adrenals/Urinary Tract: No  adrenal masses. Bilateral renal cortical thinning. Low-density mass arises from the cortex of the midpole the left kidney consistent with a cyst. No stones. No hydronephrosis. Ureters are normal in course and in caliber. Bladder is unremarkable. Stomach/Bowel: Stomach is unremarkable. No evidence of bowel obstruction. No bowel wall thickening or inflammation. Anastomosis staple line is noted along the cecum consistent with a prior appendectomy. Vascular/Lymphatic: Extensive atherosclerosis noted throughout the abdominal aorta and its branch vessels. No pathologically enlarged lymph nodes. Reproductive: Status post hysterectomy. No adnexal masses. Other: Small fat containing infraumbilical midline hernia. No ascites. Musculoskeletal: Mild compression fracture of T11. Moderate compression fracture of T12. These are stable when compared to the prior chest CT. Sclerosis along the sacral ala is also consistent with old fractures. No new fractures. Bones are diffusely demineralized. No osteoblastic or osteolytic lesions. IMPRESSION: 1. No acute findings within the abdomen or pelvis. 2. Old fractures of T11, T12 and the sacral ala. Electronically Signed   By: Amie Portland M.D.   On: 06-21-2017 16:48   Ct T-spine No Charge  Result Date: 06-21-2017 CLINICAL DATA:  84 y/o  F; fall.  Evaluation of back pain. EXAM: CT THORACIC AND LUMBAR SPINE WITHOUT CONTRAST TECHNIQUE: Multidetector CT imaging of the thoracic and  lumbar spine was performed without contrast. Multiplanar CT image reconstructions were also generated. COMPARISON:  04/17/2016 CT of the chest. Concurrent CT of chest, abdomen, and pelvis. FINDINGS: CT THORACIC SPINE FINDINGS Alignment: Moderate levocurvature of upper thoracic spine. Normal thoracic kyphosis without listhesis. Vertebrae: Stable mild T6 and T7 compression deformities. Stable mild T11 and moderate to severe T12 compression deformity. Paraspinal and other soft tissues: Please refer to the  concurrent CT of the chest. Disc levels: Mild multilevel disc and facet degenerative changes. No significant bony foraminal or canal stenosis. CT LUMBAR SPINE FINDINGS Segmentation: Mild lumbar levocurvature with apex at L3. Alignment: Normal lumbar lordosis without listhesis. Vertebrae: No acute fracture or focal pathologic process. Paraspinal and other soft tissues: Please refer to concurrent CT of the abdomen and pelvis. Disc levels: Mild L1-2 and severe L2-L4 loss of disc space height. Multilevel facet hypertrophy. No high-grade bony canal stenosis. IMPRESSION: CT THORACIC SPINE IMPRESSION 1. Stable chronic T6, T7, T11, and T12 compression deformities. 2. No acute fracture identified. CT LUMBAR SPINE IMPRESSION No acute fracture identified. Electronically Signed   By: Mitzi Hansen M.D.   On: 06/12/2017 16:55   Ct L-spine No Charge  Result Date: 06/16/2017 CLINICAL DATA:  84 y/o  F; fall.  Evaluation of back pain. EXAM: CT THORACIC AND LUMBAR SPINE WITHOUT CONTRAST TECHNIQUE: Multidetector CT imaging of the thoracic and lumbar spine was performed without contrast. Multiplanar CT image reconstructions were also generated. COMPARISON:  04/17/2016 CT of the chest. Concurrent CT of chest, abdomen, and pelvis. FINDINGS: CT THORACIC SPINE FINDINGS Alignment: Moderate levocurvature of upper thoracic spine. Normal thoracic kyphosis without listhesis. Vertebrae: Stable mild T6 and T7 compression deformities. Stable mild T11 and moderate to severe T12 compression deformity. Paraspinal and other soft tissues: Please refer to the concurrent CT of the chest. Disc levels: Mild multilevel disc and facet degenerative changes. No significant bony foraminal or canal stenosis. CT LUMBAR SPINE FINDINGS Segmentation: Mild lumbar levocurvature with apex at L3. Alignment: Normal lumbar lordosis without listhesis. Vertebrae: No acute fracture or focal pathologic process. Paraspinal and other soft tissues: Please refer to  concurrent CT of the abdomen and pelvis. Disc levels: Mild L1-2 and severe L2-L4 loss of disc space height. Multilevel facet hypertrophy. No high-grade bony canal stenosis. IMPRESSION: CT THORACIC SPINE IMPRESSION 1. Stable chronic T6, T7, T11, and T12 compression deformities. 2. No acute fracture identified. CT LUMBAR SPINE IMPRESSION No acute fracture identified. Electronically Signed   By: Mitzi Hansen M.D.   On: 06/03/2017 16:55    ____________________________________________   PROCEDURES  Procedure(s) performed:   Procedures  CRITICAL CARE Performed by: Marily Memos Total critical care time: 35 minutes Critical care time was exclusive of separately billable procedures and treating other patients. Critical care was necessary to treat or prevent imminent or life-threatening deterioration. Critical care was time spent personally by me on the following activities: development of treatment plan with patient and/or surrogate as well as nursing, discussions with consultants, evaluation of patient's response to treatment, examination of patient, obtaining history from patient or surrogate, ordering and performing treatments and interventions, ordering and review of laboratory studies, ordering and review of radiographic studies, pulse oximetry and re-evaluation of patient's condition.  ____________________________________________   INITIAL IMPRESSION / ASSESSMENT AND PLAN / ED COURSE  Will eval the areas of pain.  Make sure there is no broken bones.  Also evaluate for infection or other metabolic causes for weakness.  Fluid hydrate for now.  Patient found to have multiple PEs.  Difficult to tell if some old but there is deafly some acute component to at least a couple.  Discussed case with your care who recommended echocardiogram and discussion with IR for possible intervention.  I discussed with Dr. hand with interventional radiology who stated he did not think that she would be  a candidate for lysis.  Still recommended to get an echocardiogram but did not need to be emergent and could be done tomorrow.  Discussed with Dr. Adrian Blackwater who will admit to the hospital for the same. Heparin started to bridge to coumadin. Patient stable condition with stable vital signs not requiring oxygen.     Pertinent labs & imaging results that were available during my care of the patient were reviewed by me and considered in my medical decision making (see chart for details).  ____________________________________________  FINAL CLINICAL IMPRESSION(S) / ED DIAGNOSES  Final diagnoses:  Acute pulmonary embolism without acute cor pulmonale, unspecified pulmonary embolism type (HCC)  Weakness     MEDICATIONS GIVEN DURING THIS VISIT:  Medications  divalproex (DEPAKOTE) DR tablet 125 mg (has no administration in time range)  feeding supplement (ENSURE ENLIVE) (ENSURE ENLIVE) liquid 237 mL (has no administration in time range)  lisinopril (PRINIVIL,ZESTRIL) tablet 20 mg (has no administration in time range)  loperamide (IMODIUM) capsule 2 mg (has no administration in time range)  Melatonin TABS 5 mg (has no administration in time range)  metoprolol succinate (TOPROL-XL) 24 hr tablet 25 mg (has no administration in time range)  mineral oil-hydrophilic petrolatum (AQUAPHOR) ointment (has no administration in time range)  multivitamin with minerals tablet 1 tablet (has no administration in time range)  acetaminophen (TYLENOL) tablet 650 mg (has no administration in time range)    Or  acetaminophen (TYLENOL) suppository 650 mg (has no administration in time range)  0.9 %  sodium chloride infusion ( Intravenous New Bag/Given 21-Jun-2017 1858)  ondansetron (ZOFRAN) tablet 4 mg (has no administration in time range)    Or  ondansetron (ZOFRAN) injection 4 mg (has no administration in time range)  heparin bolus via infusion 2,000 Units (2,000 Units Intravenous Bolus from Bag 2017/06/21 1809)     Followed by  heparin ADULT infusion 100 units/mL (25000 units/253mL sodium chloride 0.45%) (750 Units/hr Intravenous New Bag/Given June 21, 2017 1810)  LORazepam (ATIVAN) injection 0.5 mg (has no administration in time range)  lactated ringers bolus 1,000 mL (0 mLs Intravenous Stopped 2017-06-21 1415)  LORazepam (ATIVAN) injection 0.5 mg (0.5 mg Intravenous Given 06-21-2017 1500)  iopamidol (ISOVUE-370) 76 % injection 80 mL (80 mLs Intravenous Contrast Given 2017/06/21 1557)     NEW OUTPATIENT MEDICATIONS STARTED DURING THIS VISIT:  Current Discharge Medication List      Note:  This note was prepared with assistance of Dragon voice recognition software. Occasional wrong-word or sound-a-like substitutions may have occurred due to the inherent limitations of voice recognition software.   Marily Memos, MD 2017/06/21 2145

## 2017-06-11 NOTE — Progress Notes (Signed)
ANTICOAGULATION CONSULT NOTE - Initial Consult  Pharmacy Consult for heparin Indication: PE  Allergies  Allergen Reactions  . Ativan [Lorazepam]   . Haldol [Haloperidol Lactate]    Patient Measurements: Height:  (154.9 cm) Weight: 104 lb (47.2 kg) IBW/kg (Calculated) : 47.8 HEPARIN DW (KG): 47.2   Vital Signs: Temp: 98.1 F (36.7 C) (05/24 1042) Temp Source: Oral (05/24 1042) BP: 190/85 (05/24 1700) Pulse Rate: 52 (05/24 1530)  Labs: Recent Labs    05/30/2017 1329  HGB 12.1  HCT 38.5  PLT 189  CREATININE 0.93  TROPONINI <0.03   Estimated Creatinine Clearance: 30.6 mL/min (by C-G formula based on SCr of 0.93 mg/dL).  Medical History: Past Medical History:  Diagnosis Date  . Chronic diarrhea   . Dementia   . Fracture of one rib, unsp side, init for clos fx   . History of vertebral fracture    lumbar region, after fall  . Hypertension   . Osteoporosis   . Pacemaker   . Pulmonary embolus (HCC)   . Sternum fx   . Stroke [facility])    Medications:  See PTA med list Xarelto PTA, new PE.  Assessment: Okay for Protocol, baseline aPTT and Heparin Level pending.  Last Xarelto at 8am today.  New PE will start Heparin now (discussed with MD)  Goal of Therapy:  APTT goal 60-102 Heparin level 0.3-0.7 units/ml Monitor platelets by anticoagulation protocol: Yes   Plan:  Give 2000 units bolus x 1 Start heparin infusion at 750 units/hr Check anti-Xa level and aPTT in 6-8 hours and daily while on heparin Continue to monitor H&H and platelets  Mady Gemma 06/10/2017,5:34 PM

## 2017-06-11 NOTE — ED Triage Notes (Addendum)
Pt brought in by EMS from [facility] due to fall last night. Staff reported pt complained of back pain but denies pain now. Husband reports 4 falls in 6 days and has been weak

## 2017-06-11 NOTE — H&P (Addendum)
History and Physical  Theresa Woodard MVH:[id number] DOB: July 30, 1926 DOA: 05/22/2017  Referring physician: Dr Clayborne Dana, ED physician PCP: Ignatius Specking, MD  Outpatient Specialists: none  Patient Coming From: [facility]  Chief Complaint: falls  HPI: Theresa Woodard is a 85 y.o. female with a history of stroke, hypertension, dementia, history of PEs a year ago on Xarelto with no missed doses per MAR.  Patient had 4 falls in 6 days and has been weak.  She presents to the emergency department for evaluation.  Patient unable to provide history due to dementia.  No palliating or provoking factors with the falls.  Do not occur during particular time of the day.  Emergency Department Course: CT scan shows PE bilaterally.  Patient started on heparin  Review of Systems:  Unable to obtain due to dementia  Past Medical History:  Diagnosis Date  . Chronic diarrhea   . Dementia   . Fracture of one rib, unsp side, init for clos fx   . History of vertebral fracture    lumbar region, after fall  . Hypertension   . Osteoporosis   . Pacemaker   . Pulmonary embolus (HCC)   . Sternum fx   . Stroke [facility])    Past Surgical History:  Procedure Laterality Date  . ABDOMINAL HYSTERECTOMY    . APPENDECTOMY    . PACEMAKER IMPLANT     Social History:  reports that she has never smoked. She has never used smokeless tobacco. She reports that she does not drink alcohol or use drugs. Patient lives at [facility]  Allergies  Allergen Reactions  . Ativan [Lorazepam]   . Haldol [Haloperidol Lactate]     No family history on file.  Unable to obtain  Prior to Admission medications   Medication Sig Start Date End Date Taking? Authorizing Provider  acetaminophen (TYLENOL) 500 MG tablet Take 500 mg by mouth every 6 (six) hours as needed for moderate pain.    Yes [provider]  calcium carbonate (TUMS - DOSED IN MG ELEMENTAL CALCIUM) 500 MG chewable tablet Chew 1 tablet by mouth 2 (two)  times daily.   Yes [provider]  cholecalciferol (VITAMIN D) 1000 units tablet Take 1,000 Units by mouth daily.   Yes [provider]  cholecalciferol (VITAMIN D) 400 units TABS tablet Take 400 Units by mouth 2 (two) times daily.   Yes [provider]  divalproex (DEPAKOTE) 125 MG DR tablet Take 125 mg by mouth daily.   Yes [provider]  ENSURE (ENSURE) Take 237 mLs by mouth daily.   Yes [provider]  lisinopril (PRINIVIL,ZESTRIL) 20 MG tablet Take 20 mg by mouth daily.   Yes [provider]  loperamide (IMODIUM) 2 MG capsule Take 2 mg by mouth 2 (two) times daily as needed for diarrhea or loose stools.   Yes [provider]  Melatonin 5 MG TABS Take 1 tablet by mouth at bedtime.   Yes [provider]  metoprolol succinate (TOPROL-XL) 25 MG 24 hr tablet Take 25 mg by mouth daily.   Yes [provider]  mineral oil-hydrophilic petrolatum (AQUAPHOR) ointment Apply topically as needed for dry skin. Apply topically to feet and legs twice daily x 14 days for dry skin. 01/10/16  Yes Ngetich, Dinah C, NP  Multiple Vitamin (MULTIVITAMIN WITH MINERALS) TABS tablet Take 1 tablet by mouth daily.   Yes [provider]  Psyllium (METAMUCIL PO) Take 2 capsules by mouth daily.  Yes [provider]  rivaroxaban (XARELTO) 20 MG TABS tablet Take 20 mg by mouth daily.   Yes [provider]    Physical Exam: BP (!) 190/85   Pulse (!) 52   Temp 98.1 F (36.7 C) (Oral)   Resp 18   Ht 5\' 1"  (1.549 m)   Wt 47.2 kg (104 lb)   SpO2 94%   BMI 19.65 kg/m   . General: Elderly Caucasian female.  Somnolent due to Ativan. No acute cardiopulmonary distress.  Marland Kitchen HEENT: Normocephalic atraumatic.  Right and left ears normal in appearance.  Pupils equal, round, reactive to light. Extraocular muscles are intact. Sclerae anicteric and noninjected.  Moist mucosal membranes. No mucosal lesions.  . Neck: Neck  supple without lymphadenopathy. No carotid bruits. No masses palpated.  . Cardiovascular: Regular rate with normal S1-S2 sounds. No murmurs, rubs, gallops auscultated. No JVD.  Marland Kitchen Respiratory: Good respiratory effort with no wheezes, rales, rhonchi. Lungs clear to auscultation bilaterally.  No accessory muscle use. . Abdomen: Soft, nontender, nondistended. Active bowel sounds. No masses or hepatosplenomegaly  . Skin: Multiple bruises and a few abrasions..  Dry, warm to touch. 2+ dorsalis pedis and radial pulses. . Musculoskeletal: No calf or leg pain. All major joints not erythematous nontender.  No upper or lower joint deformation.  Good ROM.  No contractures  . Psychiatric: Unable to obtain . Neurologic: Unable to perform          Labs on Admission: I have personally reviewed following labs and imaging studies  CBC: Recent Labs  Lab 06/06/17 0005 13-Jun-2017 1329  WBC 9.7 7.3  NEUTROABS 8.0* 5.3  HGB 12.6 12.1  HCT 40.0 38.5  MCV 93.2 94.8  PLT 196 189   Basic Metabolic Panel: Recent Labs  Lab 06/06/17 0005 2017-06-13 1329  NA 139 135  K 4.5 4.5  CL 104 102  CO2 26 26  GLUCOSE 113* 95  BUN 24* 19  CREATININE 0.82 0.93  CALCIUM 9.0 8.5*   GFR: Estimated Creatinine Clearance: 30.6 mL/min (by C-G formula based on SCr of 0.93 mg/dL). Liver Function Tests: Recent Labs  Lab 13-Jun-2017 1329  AST 22  ALT 12*  ALKPHOS 77  BILITOT 0.5  PROT 6.1*  ALBUMIN 3.0*   No results for input(s): LIPASE, AMYLASE in the last 168 hours. No results for input(s): AMMONIA in the last 168 hours. Coagulation Profile: No results for input(s): INR, PROTIME in the last 168 hours. Cardiac Enzymes: Recent Labs  Lab 13-Jun-2017 1329  TROPONINI <0.03   BNP (last 3 results) No results for input(s): PROBNP in the last 8760 hours. HbA1C: No results for input(s): HGBA1C in the last 72 hours. CBG: No results for input(s): GLUCAP in the last 168 hours. Lipid Profile: No results for input(s): CHOL,  HDL, LDLCALC, TRIG, CHOLHDL, LDLDIRECT in the last 72 hours. Thyroid Function Tests: No results for input(s): TSH, T4TOTAL, FREET4, T3FREE, THYROIDAB in the last 72 hours. Anemia Panel: No results for input(s): VITAMINB12, FOLATE, FERRITIN, TIBC, IRON, RETICCTPCT in the last 72 hours. Urine analysis:    Component Value Date/Time   COLORURINE YELLOW 06/13/17 1325   APPEARANCEUR CLEAR 06-13-2017 1325   LABSPEC 1.013 Jun 13, 2017 1325   PHURINE 5.0 2017-06-13 1325   GLUCOSEU NEGATIVE 06-13-17 1325   HGBUR NEGATIVE 06-13-2017 1325   BILIRUBINUR NEGATIVE 06/13/17 1325   KETONESUR NEGATIVE 06/13/17 1325   PROTEINUR NEGATIVE Jun 13, 2017 1325   NITRITE NEGATIVE 06-13-2017 1325   LEUKOCYTESUR NEGATIVE 06/13/2017 1325   Sepsis  Labs: (procalcitonin:4,lacticidven:4) )No results found for this or any previous visit (from the past 240 hour(s)).   Radiological Exams on Admission: Ct Head Wo Contrast  Result Date: 2017/06/20 CLINICAL DATA:  Ataxia, multiple falls. EXAM: CT HEAD WITHOUT CONTRAST TECHNIQUE: Contiguous axial images were obtained from the base of the skull through the vertex without intravenous contrast. COMPARISON:  CT scan of Jun 05, 2017. FINDINGS: Brain: Stable diffuse cortical atrophy is noted. Mild chronic ischemic white matter disease is noted. No mass effect or midline shift is noted. Ventricular size is within normal limits. There is no evidence of mass lesion, hemorrhage or acute infarction. Vascular: No hyperdense vessel or unexpected calcification. Skull: Normal. Negative for fracture or focal lesion. Sinuses/Orbits: No acute finding. Other: None. IMPRESSION: Stable diffuse cortical atrophy. Mild chronic ischemic white matter disease. No acute intracranial abnormality seen. Electronically Signed   By: Lupita Raider, M.D.   On: June 20, 2017 16:41   Ct Angio Chest Pe W And/or Wo Contrast  Result Date: 20-Jun-2017 CLINICAL DATA:  fall last night. Staff reported pt  complained of back pain but denies pain now. Husband reports 4 falls in 6 days and has been weak EXAM: CT ANGIOGRAPHY CHEST WITH CONTRAST TECHNIQUE: Multidetector CT imaging of the chest was performed using the standard protocol during bolus administration of intravenous contrast. Multiplanar CT image reconstructions and MIPs were obtained to evaluate the vascular anatomy. CONTRAST:  80mL ISOVUE-370 IOPAMIDOL (ISOVUE-370) INJECTION 76% COMPARISON:  04/17/2016 FINDINGS: Cardiovascular: Moderate cardiomegaly. No pericardial effusion. Right ventricular dilatation. RV/LV ratio 1.5. Dilated central pulmonary arteries, present since 01/31/2015. There is a partially large central filling defect at the bifurcation of the left pulmonary artery. Partially occlusive embolus in left lower lobe branch. Possible nonocclusive thrombus in a posterior right lower lobe branch although motion degrades images through the lung bases. Dilatation of the ascending aorta up to 4.3 cm diameter. There is incomplete opacification of the arch and descending thoracic aorta, limiting assessment for aortic dissection. Scattered coarse calcified plaque. Mediastinum/Nodes: No hilar or mediastinal adenopathy. Lungs/Pleura: Trace right pleural effusion. No pneumothorax. Minimal dependent atelectasis posteriorly in both lung bases. Lungs otherwise clear. Upper Abdomen: No acute findings Musculoskeletal: Interval T12 vertebral body cement augmentation. Stable compression deformities of T6, T7 and T11. Old healed sternal fracture. No acute fracture. Review of the MIP images confirms the above findings. IMPRESSION: 1. Positive for bilateral acute pulmonary emboli, left greater than right. CT evidence of right heart strain (RV/LV Ratio = 1.5) suggesting at least submassive (intermediate risk) PE. The presence of right heart strain has been associated with an increased risk of morbidity and mortality. Please activate Code PE by paging [phone]. Critical  Value/emergent results were called by telephone at the time of interpretation on 2017/06/20 at 4:51 pm to Dr. Marily Memos , who verbally acknowledged these results. 2. 4.3 cm ascending aortic aneurysm. Recommend annual imaging followup by CTA or MRA. This recommendation follows 2010 ACCF/AHA/AATS/ACR/ASA/SCA/SCAI/SIR/STS/SVM Guidelines for the Diagnosis and Management of Patients with Thoracic Aortic Disease. Circulation. 2010; 121: U981-X914 Electronically Signed   By: Corlis Leak M.D.   On: June 20, 2017 16:52   Ct Abdomen Pelvis W Contrast  Result Date: June 20, 2017 CLINICAL DATA:  Per ED notes: Pt brought in by EMS from [facility] due to fall last night. Staff reported pt complained of back pain but denies pain now. Husband reports 4 falls in 6 days and has been weak EXAM: CT ABDOMEN AND PELVIS WITH CONTRAST TECHNIQUE: Multidetector CT imaging of the abdomen and  pelvis was performed using the standard protocol following bolus administration of intravenous contrast. CONTRAST:  80mL ISOVUE-370 IOPAMIDOL (ISOVUE-370) INJECTION 76% COMPARISON:  Chest CTA, 04/17/2016 FINDINGS: Lower chest: See current chest CT. Hepatobiliary: No focal liver abnormality is seen. No gallstones, gallbladder wall thickening, or biliary dilatation. Pancreas: Unremarkable. No pancreatic ductal dilatation or surrounding inflammatory changes. Spleen: Normal size spleen. Heterogeneous early enhancement that becomes more homogeneous on the delayed image. No convincing splenic mass. Adrenals/Urinary Tract: No adrenal masses. Bilateral renal cortical thinning. Low-density mass arises from the cortex of the midpole the left kidney consistent with a cyst. No stones. No hydronephrosis. Ureters are normal in course and in caliber. Bladder is unremarkable. Stomach/Bowel: Stomach is unremarkable. No evidence of bowel obstruction. No bowel wall thickening or inflammation. Anastomosis staple line is noted along the cecum consistent with a prior  appendectomy. Vascular/Lymphatic: Extensive atherosclerosis noted throughout the abdominal aorta and its branch vessels. No pathologically enlarged lymph nodes. Reproductive: Status post hysterectomy. No adnexal masses. Other: Small fat containing infraumbilical midline hernia. No ascites. Musculoskeletal: Mild compression fracture of T11. Moderate compression fracture of T12. These are stable when compared to the prior chest CT. Sclerosis along the sacral ala is also consistent with old fractures. No new fractures. Bones are diffusely demineralized. No osteoblastic or osteolytic lesions. IMPRESSION: 1. No acute findings within the abdomen or pelvis. 2. Old fractures of T11, T12 and the sacral ala. Electronically Signed   By: Amie Portland M.D.   On: 06/12/2017 16:48   Ct T-spine No Charge  Result Date: 06/12/2017 CLINICAL DATA:  84 y/o  F; fall.  Evaluation of back pain. EXAM: CT THORACIC AND LUMBAR SPINE WITHOUT CONTRAST TECHNIQUE: Multidetector CT imaging of the thoracic and lumbar spine was performed without contrast. Multiplanar CT image reconstructions were also generated. COMPARISON:  04/17/2016 CT of the chest. Concurrent CT of chest, abdomen, and pelvis. FINDINGS: CT THORACIC SPINE FINDINGS Alignment: Moderate levocurvature of upper thoracic spine. Normal thoracic kyphosis without listhesis. Vertebrae: Stable mild T6 and T7 compression deformities. Stable mild T11 and moderate to severe T12 compression deformity. Paraspinal and other soft tissues: Please refer to the concurrent CT of the chest. Disc levels: Mild multilevel disc and facet degenerative changes. No significant bony foraminal or canal stenosis. CT LUMBAR SPINE FINDINGS Segmentation: Mild lumbar levocurvature with apex at L3. Alignment: Normal lumbar lordosis without listhesis. Vertebrae: No acute fracture or focal pathologic process. Paraspinal and other soft tissues: Please refer to concurrent CT of the abdomen and pelvis. Disc levels:  Mild L1-2 and severe L2-L4 loss of disc space height. Multilevel facet hypertrophy. No high-grade bony canal stenosis. IMPRESSION: CT THORACIC SPINE IMPRESSION 1. Stable chronic T6, T7, T11, and T12 compression deformities. 2. No acute fracture identified. CT LUMBAR SPINE IMPRESSION No acute fracture identified. Electronically Signed   By: Mitzi Hansen M.D.   On: 06/14/2017 16:55   Ct L-spine No Charge  Result Date: 06/07/2017 CLINICAL DATA:  84 y/o  F; fall.  Evaluation of back pain. EXAM: CT THORACIC AND LUMBAR SPINE WITHOUT CONTRAST TECHNIQUE: Multidetector CT imaging of the thoracic and lumbar spine was performed without contrast. Multiplanar CT image reconstructions were also generated. COMPARISON:  04/17/2016 CT of the chest. Concurrent CT of chest, abdomen, and pelvis. FINDINGS: CT THORACIC SPINE FINDINGS Alignment: Moderate levocurvature of upper thoracic spine. Normal thoracic kyphosis without listhesis. Vertebrae: Stable mild T6 and T7 compression deformities. Stable mild T11 and moderate to severe T12 compression deformity. Paraspinal and other soft tissues: Please refer  to the concurrent CT of the chest. Disc levels: Mild multilevel disc and facet degenerative changes. No significant bony foraminal or canal stenosis. CT LUMBAR SPINE FINDINGS Segmentation: Mild lumbar levocurvature with apex at L3. Alignment: Normal lumbar lordosis without listhesis. Vertebrae: No acute fracture or focal pathologic process. Paraspinal and other soft tissues: Please refer to concurrent CT of the abdomen and pelvis. Disc levels: Mild L1-2 and severe L2-L4 loss of disc space height. Multilevel facet hypertrophy. No high-grade bony canal stenosis. IMPRESSION: CT THORACIC SPINE IMPRESSION 1. Stable chronic T6, T7, T11, and T12 compression deformities. 2. No acute fracture identified. CT LUMBAR SPINE IMPRESSION No acute fracture identified. Electronically Signed   By: Mitzi Hansen M.D.   On:  07-04-17 16:55    EKG: Independently reviewed.  Paced rhythm  Assessment/Plan: Principal Problem:   Pulmonary embolism, bilateral (HCC) Active Problems:   Dementia with behavioral disturbance   Cardiac pacemaker in situ   CVA, old, cognitive deficits   Essential hypertension, benign   History of pulmonary embolism    This patient was discussed with the ED physician, including pertinent vitals, physical exam findings, labs, and imaging.  We also discussed care given by the ED provider.  1. Bilateral pulmonary embolism a. Admit to stepdown b. Patient currently cardiovascularly stable c. Heparin d. In conversations with IR, this appears to be acute on chronic e. Additionally, IR discussed that patient is not a candidate for interventional measures such as thrombectomy or intracolic TPA f. Echo tomorrow g. Major question is anticoagulation after hospitalization: Coumadin versus Lovenox versus different Xa inhibitor.  May need to discuss with hematology tomorrow 2. History of CVA a. Stable b. Unable to assess prior deficits 3. Pacemaker a.  4. Hypertension a. Continue antihypertensives 5. History of pulmonary embolism 6. Dementia with behavioral disturbance a. Ativan as needed  DVT prophylaxis: On heparin Consultants: None Code Status: DNR Family Communication: Has been in the room during conversation and exam Disposition Plan: Patient to return back to [place] following stabilization   Levie Heritage, DO [facility] Pager [phone]  If 7PM-7AM, please contact night-coverage [email or web address] Password [email or web address]

## 2017-06-12 ENCOUNTER — Other Ambulatory Visit (HOSPITAL_COMMUNITY): Payer: Self-pay | Admitting: *Deleted

## 2017-06-12 ENCOUNTER — Inpatient Hospital Stay (HOSPITAL_COMMUNITY): Payer: PPO

## 2017-06-12 DIAGNOSIS — R531 Weakness: Secondary | ICD-10-CM

## 2017-06-12 LAB — PROTIME-INR
INR: 1.33
Prothrombin Time: 16.4 seconds — ABNORMAL HIGH (ref 11.4–15.2)

## 2017-06-12 LAB — BASIC METABOLIC PANEL
ANION GAP: 7 (ref 5–15)
BUN: 15 mg/dL (ref 6–20)
CHLORIDE: 105 mmol/L (ref 101–111)
CO2: 26 mmol/L (ref 22–32)
Calcium: 8.3 mg/dL — ABNORMAL LOW (ref 8.9–10.3)
Creatinine, Ser: 0.83 mg/dL (ref 0.44–1.00)
GFR calc non Af Amer: >60 mL/min (ref >60)
Glucose, Bld: 90 mg/dL (ref 65–99)
POTASSIUM: 4.1 mmol/L (ref 3.5–5.1)
Sodium: 138 mmol/L (ref 135–145)

## 2017-06-12 LAB — CBC
HEMATOCRIT: 35.7 % — AB (ref 36.0–46.0)
HEMOGLOBIN: 11.4 g/dL — AB (ref 12.0–15.0)
MCH: 30.1 pg (ref 26.0–34.0)
MCHC: 31.9 g/dL (ref 30.0–36.0)
MCV: 94.2 fL (ref 78.0–100.0)
Platelets: 182 10*3/uL (ref 150–400)
RBC: 3.79 MIL/uL — AB (ref 3.87–5.11)
RDW: 17.9 % — ABNORMAL HIGH (ref 11.5–15.5)
WBC: 7.1 10*3/uL (ref 4.0–10.5)

## 2017-06-12 LAB — APTT
APTT: 115 s — AB (ref 24–36)
aPTT: 85 seconds — ABNORMAL HIGH (ref 24–36)

## 2017-06-12 LAB — TROPONIN I
Troponin I: 0.03 ng/mL (ref <0.03)
Troponin I: 0.03 ng/mL (ref <0.03)

## 2017-06-12 MED ORDER — WARFARIN - PHARMACIST DOSING INPATIENT
Freq: Every day | Status: DC
  Administered 2017-06-13: 17:00:00

## 2017-06-12 MED ORDER — LUBRIDERM DAILY MOISTURE EX LOTN
TOPICAL_LOTION | Freq: Two times a day (BID) | CUTANEOUS | Status: DC | PRN
Start: 2017-06-12 — End: 2017-06-15
  Administered 2017-06-12: 14:00:00 via TOPICAL
  Filled 2017-06-12: qty 177

## 2017-06-12 MED ORDER — WARFARIN SODIUM 2.5 MG PO TABS
2.5000 mg | ORAL_TABLET | Freq: Once | ORAL | Status: DC
  Filled 2017-06-12: qty 1

## 2017-06-12 NOTE — Progress Notes (Addendum)
ANTICOAGULATION CONSULT NOTE  Pharmacy Consult for heparin>>warfarin Indication: PE  Allergies  Allergen Reactions  . Ativan [Lorazepam]   . Haldol [Haloperidol Lactate]    Patient Measurements: Height:  (154.9 cm) Weight: 108 lb 11 oz (49.3 kg) IBW/kg (Calculated) : 47.8 HEPARIN DW (KG): 47.2   Vital Signs: Temp: 97.1 F (36.2 C) (05/25 1140) Temp Source: Axillary (05/25 1140) BP: 156/76 (05/25 0600) Pulse Rate: 55 (05/25 1140)  Labs: Recent Labs    06/09/2017 1329 05/19/2017 1804 06/09/2017 1829 06/12/17 0127 06/12/17 0806  HGB 12.1  --   --  11.4*  --   HCT 38.5  --   --  35.7*  --   PLT 189  --   --  182  --   APTT  --  35  --  115*  --   LABPROT  --  17.1*  --  16.4*  --   INR  --  1.40  --  1.33  --   HEPARINUNFRC  --  1.87*  --   --   --   CREATININE 0.93  --   --  0.83  --   TROPONINI <0.03  --  0.04* 0.03* 0.03*   Estimated Creatinine Clearance: 34.7 mL/min (by C-G formula based on SCr of 0.83 mg/dL).  Assessment: Bridging to warfarin tx Okay for Protocol, baseline aPTT and Heparin Level pending.  Last Xarelto at 8am 5/24.  New PE will start Heparin now (discussed with MD).  Initial aPTT > goal.  No bleeding noted at this time.  CBC stable.  Goal of Therapy:  Goal INR 2-3 APTT goal 60-102 Heparin level 0.3-0.7 units/ml Monitor platelets by anticoagulation protocol: Yes   Plan:   Warfarin 2.5mg  x1 dose today; daily PT/INR  Reduce heparin infusion to 650 units/hr Check aPTT in 6-8 hours and daily while on heparin Continue to monitor H&H and platelets Daily Heparin level, if/when heparin level correlates to results matching aPTT results will transition to monitor heparin via Heparin Levels.  Tama High 06/12/2017,1:06 PM  PM: aPTT @ goal. No rate change for Heparin. F/U AM labs.  Mady Gemma, [facility] 06/12/2017 9:53 PM

## 2017-06-12 NOTE — Progress Notes (Signed)

## 2017-06-12 NOTE — Progress Notes (Signed)
Completed parasternals of echo however, patient became combative when I attempted apicals. Nurse said patient was due for ativan and patient should be more compliant in an hour. Will re attempt.  Jeryl Columbia, RDCS

## 2017-06-12 NOTE — Progress Notes (Signed)
PROGRESS NOTE                                                                                                                                                                                                             Patient Demographics:    Theresa Woodard, is a 84 y.o. female, DOB - [birth year], ZOX:[id number]  Admit date - 14-Jun-2017   Admitting Physician Levie Heritage, DO  Outpatient Primary MD for the patient is Ignatius Specking, MD  LOS - 1  Outpatient Specialists: None  Chief Complaint  Patient presents with  . Fall       Brief Narrative   84 year old female with history of stroke, hypertension, advanced dementia, history of PE about a year ago on Xarelto (does not appear to have Mr. dose) presented with 4 episodes of fall in the past 6 days and complained of generalized weakness and back pain.  She has gotten weak and difficult to ambulate. In the ED she was hypertensive with stable remaining vitals.  A CT of the thoracic and lumbar spine showed chronic thoracic fractures with no acute findings.  CT of the abdomen and pelvis was unremarkable.  She then underwent CT angiogram of the chest with contrast which showed bilateral acute PE (L >R) with CT evidence of right heart strain.  Also found to have 4.3 cm ascending aortic aneurysm. Head CT was negative for acute findings. Patient started on IV heparin and admitted to stepdown unit.   Subjective:   Patient is very confused and required mittens overnight.  Complains of back pain.   Assessment  & Plan :    Principal Problem:   Pulmonary embolism, bilateral [facility]) Admitting hospitalist discussed with IR who recommended this is likely acute on chronic PE.  Given her advanced age and dementia with stable hemodynamic status is not a candidate for thrombolysis. Started on IV heparin.  I discussed with hematologist on-call ( Dr Karie Kirks) who recommended starting her on  Coumadin.  Pending 2D echo to evaluate right heart strain.  Will obtain Doppler lower extremity to evaluate for DVTs.  Active Problems: Elevated troponin Mild associated with clot burden.  Stable on telemetry.  History of CVA Stable.  Continue Depakote.  Essential hypertension Blood pressure elevated.  Continue home dose Toprol and lisinopril.     Dementia with behavioral disturbance, Continue  low-dose Ativan for anxiety/agitation.  Allergic to Haldol.    Cardiac pacemaker in situ  Recurrent fall.   Will evaluate with PT.  If fall risk is significant will need to weigh risk versus benefit of anticoagulation and have palliative care consult for goals of care discussion.   Code Status : DNR  Family Communication  : None at bedside  Disposition Plan  : Pending clinical improvement, further work-up  Barriers For Discharge : Active symptoms  Consults  : Discussed with hematologist on the phone  Procedures  : CT head, CT angiogram chest, CT abdomen, CT thoracic and lumbar spine  DVT Prophylaxis  : IV heparin and Coumadin  Lab Results  Component Value Date   PLT 182 06/12/2017    Antibiotics  :   Anti-infectives (From admission, onward)   None        Objective:   Vitals:   06/12/17 0500 06/12/17 0600 06/12/17 0731 06/12/17 1140  BP: (!) 177/80 (!) 156/76    Pulse: (!) 53 (!) 53 (!) 54 (!) 55  Resp: (!) 22  Temp:   (!) 97.3 F (36.3 C) (!) 97.1 F (36.2 C)  TempSrc:   Axillary Axillary  SpO2: 99% 95% 96% 96%  Weight:      Height:        Wt Readings from Last 3 Encounters:  06/12/17 49.3 kg (108 lb 11 oz)  06/05/17 47.2 kg (104 lb)  01/10/16 47.3 kg (104 lb 3.2 oz)     Intake/Output Summary (Last 24 hours) at 06/12/2017 1143 Last data filed at 06/12/2017 0751 Gross per 24 hour  Intake 1584.58 ml  Output 800 ml  Net 784.58 ml     Physical Exam  Gen: not in distress appears fatigued and restless HEENT: no pallor, moist mucosa, supple  neck Chest: clear b/l, no added sounds CVS: N S1&S2, no murmurs, GI: soft, NT, ND,  Musculoskeletal: warm, no edema CNS: Alert and oriented x0    Data Review:    CBC Recent Labs  Lab 06/06/17 0005 07-02-17 1329 06/12/17 0127  WBC 9.7 7.3 7.1  HGB 12.6 12.1 11.4*  HCT 40.0 38.5 35.7*  PLT 196 189 182  MCV 93.2 94.8 94.2  MCH 29.4 29.8 30.1  MCHC 31.5 31.4 31.9  RDW 17.9* 17.8* 17.9*  LYMPHSABS 0.8 1.1  --   MONOABS 0.9 0.8  --   EOSABS 0.1 0.1  --   BASOSABS 0.0 0.0  --     Chemistries  Recent Labs  Lab 06/06/17 0005 2017/07/02 1329 06/12/17 0127  NA 139 135 138  K 4.5 4.5 4.1  CL 104 102 105  CO2 GLUCOSE 113* 95 90  BUN 24* 19 15  CREATININE 0.82 0.93 0.83  CALCIUM 9.0 8.5* 8.3*  AST  --  22  --   ALT  --  12*  --   ALKPHOS  --  77  --   BILITOT  --  0.5  --    ------------------------------------------------------------------------------------------------------------------ No results for input(s): CHOL, HDL, LDLCALC, TRIG, CHOLHDL, LDLDIRECT in the last 72 hours.  No results found for: HGBA1C ------------------------------------------------------------------------------------------------------------------ No results for input(s): TSH, T4TOTAL, T3FREE, THYROIDAB in the last 72 hours.  Invalid input(s): FREET3 ------------------------------------------------------------------------------------------------------------------ No results for input(s): VITAMINB12, FOLATE, FERRITIN, TIBC, IRON, RETICCTPCT in the last 72 hours.  Coagulation profile Recent Labs  Lab 07/02/2017 1804 06/12/17 0127  INR 1.40 1.33    No results for input(s): DDIMER in the  last 72 hours.  Cardiac Enzymes Recent Labs  Lab 06/18/2017 1829 06/12/17 0127 06/12/17 0806  TROPONINI 0.04* 0.03* 0.03*   ------------------------------------------------------------------------------------------------------------------ No results found for: BNP  Inpatient  Medications  Scheduled Meds: . divalproex  125 mg Oral Daily  . feeding supplement (ENSURE ENLIVE)  237 mL Oral Daily  . lisinopril  20 mg Oral Daily  . metoprolol succinate  25 mg Oral Daily  . multivitamin with minerals  1 tablet Oral Daily   Continuous Infusions: . sodium chloride 100 mL/hr at 06/12/17 0500  . heparin 650 Units/hr (06/12/17 0745)   PRN Meds:.acetaminophen **OR** acetaminophen, loperamide, LORazepam, LUBRIDERM DAILY MOISTURE, mineral oil-hydrophilic petrolatum, ondansetron **OR** ondansetron (ZOFRAN) IV  Micro Results Recent Results (from the past 240 hour(s))  MRSA PCR Screening     Status: None   Collection Time: 05/19/2017  6:30 PM  Result Value Ref Range Status   MRSA by PCR NEGATIVE NEGATIVE Final    Comment:        The GeneXpert MRSA Assay (FDA approved for NASAL specimens only), is one component of a comprehensive MRSA colonization surveillance program. It is not intended to diagnose MRSA infection nor to guide or monitor treatment for MRSA infections. Performed at [facility], [address] [place], [place] [address]     Radiology Reports Dg Chest 2 View  Result Date: 06/06/2017 CLINICAL DATA:  Status post unwitnessed fall, with upper back pain. Hypertension and atrial fibrillation. EXAM: CHEST - 2 VIEW COMPARISON:  Chest radiograph and CTA of the chest performed 04/17/2016 FINDINGS: The lungs are hypoexpanded. Mild left basilar airspace opacity may reflect atelectasis or mild pneumonia. There is no evidence of pleural effusion or pneumothorax. The heart is borderline enlarged. A pacemaker is noted at the left chest wall, with leads ending at the right atrium and right ventricle. No acute osseous abnormalities are seen. There is chronic loss of height at the lower thoracic spine. IMPRESSION: 1. Lungs hypoexpanded. Mild left basilar airspace opacity may reflect atelectasis or mild pneumonia. Borderline cardiomegaly. 2. Chronic loss of height of the  lower thoracic spine. Electronically Signed   By: Roanna Raider M.D.   On: 06/06/2017 00:11   Dg Pelvis 1-2 Views  Result Date: 06/06/2017 CLINICAL DATA:  Status post unwitnessed fall, with lower back pain. Concern for pelvic injury. Initial encounter. EXAM: PELVIS - 1-2 VIEW COMPARISON:  None. FINDINGS: There is no evidence of fracture or dislocation. Both femoral heads are seated normally within their respective acetabula. No significant degenerative change is appreciated. Sclerosis is noted at the sacroiliac joints bilaterally. The visualized bowel gas pattern is grossly unremarkable in appearance. Scattered postoperative change is noted about the pelvis. Vascular calcifications are seen. IMPRESSION: No evidence of fracture or dislocation. Electronically Signed   By: Roanna Raider M.D.   On: 06/06/2017 00:11   Ct Head Wo Contrast  Result Date: 05/25/2017 CLINICAL DATA:  Ataxia, multiple falls. EXAM: CT HEAD WITHOUT CONTRAST TECHNIQUE: Contiguous axial images were obtained from the base of the skull through the vertex without intravenous contrast. COMPARISON:  CT scan of Jun 05, 2017. FINDINGS: Brain: Stable diffuse cortical atrophy is noted. Mild chronic ischemic white matter disease is noted. No mass effect or midline shift is noted. Ventricular size is within normal limits. There is no evidence of mass lesion, hemorrhage or acute infarction. Vascular: No hyperdense vessel or unexpected calcification. Skull: Normal. Negative for fracture or focal lesion. Sinuses/Orbits: No acute finding. Other: None. IMPRESSION: Stable diffuse cortical atrophy. Mild chronic  ischemic white matter disease. No acute intracranial abnormality seen. Electronically Signed   By: Lupita Raider, M.D.   On: Jun 20, 2017 16:41   Ct Head Wo Contrast  Result Date: 06/05/2017 CLINICAL DATA:  Status post unwitnessed fall. Concern for head or cervical spine injury. Hallucinations. EXAM: CT HEAD WITHOUT CONTRAST CT CERVICAL SPINE  WITHOUT CONTRAST TECHNIQUE: Multidetector CT imaging of the head and cervical spine was performed following the standard protocol without intravenous contrast. Multiplanar CT image reconstructions of the cervical spine were also generated. COMPARISON:  CT of the head and cervical spine performed 04/16/2016 FINDINGS: CT HEAD FINDINGS Brain: No evidence of acute infarction, hemorrhage, hydrocephalus, extra-axial collection or mass lesion / mass effect. Prominence of the ventricles and sulci reflects moderately severe cortical volume loss. Cerebellar atrophy is noted. Scattered periventricular and subcortical white matter change likely reflects small vessel ischemic microangiopathy. A chronic lacunar infarct is noted at the left basal ganglia. The brainstem and fourth ventricle are within normal limits. The basal ganglia are unremarkable in appearance. The cerebral hemispheres demonstrate grossly normal gray-white differentiation. No mass effect or midline shift is seen. Vascular: No hyperdense vessel or unexpected calcification. Skull: There is no evidence of fracture; visualized osseous structures are unremarkable in appearance. Sinuses/Orbits: The visualized portions of the orbits are within normal limits. The paranasal sinuses and mastoid air cells are well-aerated. Other: No significant soft tissue abnormalities are seen. CT CERVICAL SPINE FINDINGS Alignment: There is grade 1 anterolisthesis of C3 on C4 and of C4 on C5, with underlying facet disease. Skull base and vertebrae: No acute fracture. No primary bone lesion or focal pathologic process. Soft tissues and spinal canal: No prevertebral fluid or swelling. No visible canal hematoma. Disc levels: Multilevel disc space narrowing is noted along the cervical spine, with scattered anterior and posterior disc osteophyte complexes. Upper chest: The visualized lung apices are clear. The thyroid gland is unremarkable. Dense calcification is seen at the carotid  bifurcations bilaterally, with likely mild luminal narrowing bilaterally. Other: No additional soft tissue abnormalities are seen. IMPRESSION: 1. No evidence of traumatic intracranial injury or fracture. 2. No evidence of acute fracture or subluxation along the cervical spine. 3. Moderately severe cortical volume loss and scattered small vessel ischemic microangiopathy. 4. Chronic lacunar infarct at the left basal ganglia. 5. Degenerative change noted along the cervical spine. 6. Dense calcification at the carotid bifurcations bilaterally, with likely mild luminal narrowing bilaterally. Carotid ultrasound would be helpful for further evaluation, when and as deemed clinically appropriate. Electronically Signed   By: Roanna Raider M.D.   On: 06/05/2017 23:51   Ct Angio Chest Pe W And/or Wo Contrast  Result Date: 2017-06-20 CLINICAL DATA:  fall last night. Staff reported pt complained of back pain but denies pain now. Husband reports 4 falls in 6 days and has been weak EXAM: CT ANGIOGRAPHY CHEST WITH CONTRAST TECHNIQUE: Multidetector CT imaging of the chest was performed using the standard protocol during bolus administration of intravenous contrast. Multiplanar CT image reconstructions and MIPs were obtained to evaluate the vascular anatomy. CONTRAST:  80mL ISOVUE-370 IOPAMIDOL (ISOVUE-370) INJECTION 76% COMPARISON:  04/17/2016 FINDINGS: Cardiovascular: Moderate cardiomegaly. No pericardial effusion. Right ventricular dilatation. RV/LV ratio 1.5. Dilated central pulmonary arteries, present since 01/31/2015. There is a partially large central filling defect at the bifurcation of the left pulmonary artery. Partially occlusive embolus in left lower lobe branch. Possible nonocclusive thrombus in a posterior right lower lobe branch although motion degrades images through the lung bases. Dilatation of the  ascending aorta up to 4.3 cm diameter. There is incomplete opacification of the arch and descending thoracic aorta,  limiting assessment for aortic dissection. Scattered coarse calcified plaque. Mediastinum/Nodes: No hilar or mediastinal adenopathy. Lungs/Pleura: Trace right pleural effusion. No pneumothorax. Minimal dependent atelectasis posteriorly in both lung bases. Lungs otherwise clear. Upper Abdomen: No acute findings Musculoskeletal: Interval T12 vertebral body cement augmentation. Stable compression deformities of T6, T7 and T11. Old healed sternal fracture. No acute fracture. Review of the MIP images confirms the above findings. IMPRESSION: 1. Positive for bilateral acute pulmonary emboli, left greater than right. CT evidence of right heart strain (RV/LV Ratio = 1.5) suggesting at least submassive (intermediate risk) PE. The presence of right heart strain has been associated with an increased risk of morbidity and mortality. Please activate Code PE by paging [phone]. Critical Value/emergent results were called by telephone at the time of interpretation on 06/01/2017 at 4:51 pm to Dr. Marily Memos , who verbally acknowledged these results. 2. 4.3 cm ascending aortic aneurysm. Recommend annual imaging followup by CTA or MRA. This recommendation follows 2010 ACCF/AHA/AATS/ACR/ASA/SCA/SCAI/SIR/STS/SVM Guidelines for the Diagnosis and Management of Patients with Thoracic Aortic Disease. Circulation. 2010; 121: U981-X914 Electronically Signed   By: Corlis Leak M.D.   On: 06/13/2017 16:52   Ct Cervical Spine Wo Contrast  Result Date: 06/05/2017 CLINICAL DATA:  Status post unwitnessed fall. Concern for head or cervical spine injury. Hallucinations. EXAM: CT HEAD WITHOUT CONTRAST CT CERVICAL SPINE WITHOUT CONTRAST TECHNIQUE: Multidetector CT imaging of the head and cervical spine was performed following the standard protocol without intravenous contrast. Multiplanar CT image reconstructions of the cervical spine were also generated. COMPARISON:  CT of the head and cervical spine performed 04/16/2016 FINDINGS: CT HEAD  FINDINGS Brain: No evidence of acute infarction, hemorrhage, hydrocephalus, extra-axial collection or mass lesion / mass effect. Prominence of the ventricles and sulci reflects moderately severe cortical volume loss. Cerebellar atrophy is noted. Scattered periventricular and subcortical white matter change likely reflects small vessel ischemic microangiopathy. A chronic lacunar infarct is noted at the left basal ganglia. The brainstem and fourth ventricle are within normal limits. The basal ganglia are unremarkable in appearance. The cerebral hemispheres demonstrate grossly normal gray-white differentiation. No mass effect or midline shift is seen. Vascular: No hyperdense vessel or unexpected calcification. Skull: There is no evidence of fracture; visualized osseous structures are unremarkable in appearance. Sinuses/Orbits: The visualized portions of the orbits are within normal limits. The paranasal sinuses and mastoid air cells are well-aerated. Other: No significant soft tissue abnormalities are seen. CT CERVICAL SPINE FINDINGS Alignment: There is grade 1 anterolisthesis of C3 on C4 and of C4 on C5, with underlying facet disease. Skull base and vertebrae: No acute fracture. No primary bone lesion or focal pathologic process. Soft tissues and spinal canal: No prevertebral fluid or swelling. No visible canal hematoma. Disc levels: Multilevel disc space narrowing is noted along the cervical spine, with scattered anterior and posterior disc osteophyte complexes. Upper chest: The visualized lung apices are clear. The thyroid gland is unremarkable. Dense calcification is seen at the carotid bifurcations bilaterally, with likely mild luminal narrowing bilaterally. Other: No additional soft tissue abnormalities are seen. IMPRESSION: 1. No evidence of traumatic intracranial injury or fracture. 2. No evidence of acute fracture or subluxation along the cervical spine. 3. Moderately severe cortical volume loss and scattered  small vessel ischemic microangiopathy. 4. Chronic lacunar infarct at the left basal ganglia. 5. Degenerative change noted along the cervical spine. 6. Dense calcification at  the carotid bifurcations bilaterally, with likely mild luminal narrowing bilaterally. Carotid ultrasound would be helpful for further evaluation, when and as deemed clinically appropriate. Electronically Signed   By: Roanna Raider M.D.   On: 06/05/2017 23:51   Ct Abdomen Pelvis W Contrast  Result Date: 05/21/2017 CLINICAL DATA:  Per ED notes: Pt brought in by EMS from [place] due to fall last night. Staff reported pt complained of back pain but denies pain now. Husband reports 4 falls in 6 days and has been weak EXAM: CT ABDOMEN AND PELVIS WITH CONTRAST TECHNIQUE: Multidetector CT imaging of the abdomen and pelvis was performed using the standard protocol following bolus administration of intravenous contrast. CONTRAST:  80mL ISOVUE-370 IOPAMIDOL (ISOVUE-370) INJECTION 76% COMPARISON:  Chest CTA, 04/17/2016 FINDINGS: Lower chest: See current chest CT. Hepatobiliary: No focal liver abnormality is seen. No gallstones, gallbladder wall thickening, or biliary dilatation. Pancreas: Unremarkable. No pancreatic ductal dilatation or surrounding inflammatory changes. Spleen: Normal size spleen. Heterogeneous early enhancement that becomes more homogeneous on the delayed image. No convincing splenic mass. Adrenals/Urinary Tract: No adrenal masses. Bilateral renal cortical thinning. Low-density mass arises from the cortex of the midpole the left kidney consistent with a cyst. No stones. No hydronephrosis. Ureters are normal in course and in caliber. Bladder is unremarkable. Stomach/Bowel: Stomach is unremarkable. No evidence of bowel obstruction. No bowel wall thickening or inflammation. Anastomosis staple line is noted along the cecum consistent with a prior appendectomy. Vascular/Lymphatic: Extensive atherosclerosis noted throughout the abdominal  aorta and its branch vessels. No pathologically enlarged lymph nodes. Reproductive: Status post hysterectomy. No adnexal masses. Other: Small fat containing infraumbilical midline hernia. No ascites. Musculoskeletal: Mild compression fracture of T11. Moderate compression fracture of T12. These are stable when compared to the prior chest CT. Sclerosis along the sacral ala is also consistent with old fractures. No new fractures. Bones are diffusely demineralized. No osteoblastic or osteolytic lesions. IMPRESSION: 1. No acute findings within the abdomen or pelvis. 2. Old fractures of T11, T12 and the sacral ala. Electronically Signed   By: Amie Portland M.D.   On: 05/26/2017 16:48   Ct T-spine No Charge  Result Date: 05/21/2017 CLINICAL DATA:  84 y/o  F; fall.  Evaluation of back pain. EXAM: CT THORACIC AND LUMBAR SPINE WITHOUT CONTRAST TECHNIQUE: Multidetector CT imaging of the thoracic and lumbar spine was performed without contrast. Multiplanar CT image reconstructions were also generated. COMPARISON:  04/17/2016 CT of the chest. Concurrent CT of chest, abdomen, and pelvis. FINDINGS: CT THORACIC SPINE FINDINGS Alignment: Moderate levocurvature of upper thoracic spine. Normal thoracic kyphosis without listhesis. Vertebrae: Stable mild T6 and T7 compression deformities. Stable mild T11 and moderate to severe T12 compression deformity. Paraspinal and other soft tissues: Please refer to the concurrent CT of the chest. Disc levels: Mild multilevel disc and facet degenerative changes. No significant bony foraminal or canal stenosis. CT LUMBAR SPINE FINDINGS Segmentation: Mild lumbar levocurvature with apex at L3. Alignment: Normal lumbar lordosis without listhesis. Vertebrae: No acute fracture or focal pathologic process. Paraspinal and other soft tissues: Please refer to concurrent CT of the abdomen and pelvis. Disc levels: Mild L1-2 and severe L2-L4 loss of disc space height. Multilevel facet hypertrophy. No  high-grade bony canal stenosis. IMPRESSION: CT THORACIC SPINE IMPRESSION 1. Stable chronic T6, T7, T11, and T12 compression deformities. 2. No acute fracture identified. CT LUMBAR SPINE IMPRESSION No acute fracture identified. Electronically Signed   By: Mitzi Hansen M.D.   On: 05/25/2017 16:55   Ct L-spine  No Charge  Result Date: 06/12/2017 CLINICAL DATA:  84 y/o  F; fall.  Evaluation of back pain. EXAM: CT THORACIC AND LUMBAR SPINE WITHOUT CONTRAST TECHNIQUE: Multidetector CT imaging of the thoracic and lumbar spine was performed without contrast. Multiplanar CT image reconstructions were also generated. COMPARISON:  04/17/2016 CT of the chest. Concurrent CT of chest, abdomen, and pelvis. FINDINGS: CT THORACIC SPINE FINDINGS Alignment: Moderate levocurvature of upper thoracic spine. Normal thoracic kyphosis without listhesis. Vertebrae: Stable mild T6 and T7 compression deformities. Stable mild T11 and moderate to severe T12 compression deformity. Paraspinal and other soft tissues: Please refer to the concurrent CT of the chest. Disc levels: Mild multilevel disc and facet degenerative changes. No significant bony foraminal or canal stenosis. CT LUMBAR SPINE FINDINGS Segmentation: Mild lumbar levocurvature with apex at L3. Alignment: Normal lumbar lordosis without listhesis. Vertebrae: No acute fracture or focal pathologic process. Paraspinal and other soft tissues: Please refer to concurrent CT of the abdomen and pelvis. Disc levels: Mild L1-2 and severe L2-L4 loss of disc space height. Multilevel facet hypertrophy. No high-grade bony canal stenosis. IMPRESSION: CT THORACIC SPINE IMPRESSION 1. Stable chronic T6, T7, T11, and T12 compression deformities. 2. No acute fracture identified. CT LUMBAR SPINE IMPRESSION No acute fracture identified. Electronically Signed   By: Mitzi Hansen M.D.   On: 05/30/2017 16:55    Time Spent in minutes  25   Jazzlene Huot M.D on 06/12/2017 at 11:43  AM  Between 7am to 7pm - Pager - [phone]  After 7pm go to [email or web address] - password [facility]  [facility] -  Office  [phone]

## 2017-06-12 NOTE — Progress Notes (Signed)
Went up to try echo again, patient not had Ativan yet and getting bath. Still combative. Informed RN would try to complete echo tomorrow.

## 2017-06-12 NOTE — Progress Notes (Signed)
ANTICOAGULATION CONSULT NOTE  Pharmacy Consult for heparin Indication: PE  Allergies  Allergen Reactions  . Ativan [Lorazepam]   . Haldol [Haloperidol Lactate]    Patient Measurements: Height:  (154.9 cm) Weight: 108 lb 11 oz (49.3 kg) IBW/kg (Calculated) : 47.8 HEPARIN DW (KG): 47.2   Vital Signs: Temp: 97.5 F (36.4 C) (05/25 0400) Temp Source: Axillary (05/25 0400) BP: 156/76 (05/25 0600) Pulse Rate: 53 (05/25 0600)  Labs: Recent Labs    06/01/2017 1329 05/31/2017 1804 05/28/2017 1829 06/12/17 0127  HGB 12.1  --   --  11.4*  HCT 38.5  --   --  35.7*  PLT 189  --   --  182  APTT  --  35  --  115*  LABPROT  --  17.1*  --  16.4*  INR  --  1.40  --  1.33  HEPARINUNFRC  --  1.87*  --   --   CREATININE 0.93  --   --  0.83  TROPONINI <0.03  --  0.04* 0.03*   Estimated Creatinine Clearance: 34.7 mL/min (by C-G formula based on SCr of 0.83 mg/dL).  Assessment: Okay for Protocol, baseline aPTT and Heparin Level pending.  Last Xarelto at 8am 5/24.  New PE will start Heparin now (discussed with MD).  Initial aPTT > goal.  No bleeding noted at this time.  CBC stable.  Goal of Therapy:  APTT goal 60-102 Heparin level 0.3-0.7 units/ml Monitor platelets by anticoagulation protocol: Yes   Plan:  Reduce heparin infusion to 650 units/hr Check aPTT in 6-8 hours and daily while on heparin Continue to monitor H&H and platelets Daily Heparin level, if/when heparin level correlates to results matching aPTT results will transition to monitor heparin via Heparin Levels.  Mady Gemma 06/12/2017,7:12 AM

## 2017-06-13 ENCOUNTER — Other Ambulatory Visit: Payer: Self-pay

## 2017-06-13 ENCOUNTER — Inpatient Hospital Stay (HOSPITAL_COMMUNITY): Payer: PPO

## 2017-06-13 DIAGNOSIS — I340 Nonrheumatic mitral (valve) insufficiency: Secondary | ICD-10-CM

## 2017-06-13 DIAGNOSIS — I10 Essential (primary) hypertension: Secondary | ICD-10-CM

## 2017-06-13 LAB — CBC
HCT: 37.8 % (ref 36.0–46.0)
HEMOGLOBIN: 11.7 g/dL — AB (ref 12.0–15.0)
MCH: 29.5 pg (ref 26.0–34.0)
MCHC: 31 g/dL (ref 30.0–36.0)
MCV: 95.2 fL (ref 78.0–100.0)
PLATELETS: 180 10*3/uL (ref 150–400)
RBC: 3.97 MIL/uL (ref 3.87–5.11)
RDW: 18 % — ABNORMAL HIGH (ref 11.5–15.5)
WBC: 6 10*3/uL (ref 4.0–10.5)

## 2017-06-13 LAB — ECHOCARDIOGRAM COMPLETE
Height: 61 in
WEIGHTICAEL: 1721.35 [oz_av]

## 2017-06-13 LAB — PROTIME-INR
INR: 1.14
PROTHROMBIN TIME: 14.5 s (ref 11.4–15.2)

## 2017-06-13 LAB — HEPARIN LEVEL (UNFRACTIONATED): Heparin Unfractionated: 0.61 IU/mL (ref 0.30–0.70)

## 2017-06-13 LAB — APTT: aPTT: 82 seconds — ABNORMAL HIGH (ref 24–36)

## 2017-06-13 MED ORDER — METOPROLOL SUCCINATE ER 50 MG PO TB24
50.0000 mg | ORAL_TABLET | Freq: Every day | ORAL | Status: DC
  Administered 2017-06-14: 50 mg via ORAL
  Filled 2017-06-13 (×2): qty 1

## 2017-06-13 MED ORDER — HYDRALAZINE HCL 20 MG/ML IJ SOLN
20.0000 mg | Freq: Four times a day (QID) | INTRAMUSCULAR | Status: DC | PRN
  Administered 2017-06-13 – 2017-06-14 (×2): 20 mg via INTRAVENOUS
  Filled 2017-06-13 (×2): qty 1

## 2017-06-13 MED ORDER — WARFARIN SODIUM 2.5 MG PO TABS
2.5000 mg | ORAL_TABLET | Freq: Once | ORAL | Status: AC
  Administered 2017-06-13: 2.5 mg via ORAL
  Filled 2017-06-13: qty 1

## 2017-06-13 MED ORDER — HYDRALAZINE HCL 20 MG/ML IJ SOLN
10.0000 mg | Freq: Four times a day (QID) | INTRAMUSCULAR | Status: DC | PRN
  Administered 2017-06-13: 10 mg via INTRAVENOUS
  Filled 2017-06-13: qty 1

## 2017-06-13 NOTE — Progress Notes (Signed)
ANTICOAGULATION CONSULT NOTE  Pharmacy Consult for heparin>>warfarin Indication: PE  Allergies  Allergen Reactions  . Ativan [Lorazepam]   . Haldol [Haloperidol Lactate]    Patient Measurements: Height:  (154.9 cm) Weight: 107 lb 9.4 oz (48.8 kg) IBW/kg (Calculated) : 47.8 HEPARIN DW (KG): 47.2   Vital Signs: Temp: 98 F (36.7 C) (05/26 0748) Temp Source: Axillary (05/26 0748) BP: 206/90 (05/26 0938) Pulse Rate: 72 (05/26 0938)  Labs: Recent Labs    2017/07/02 1329  07/02/17 1804 July 02, 2017 1829 06/12/17 0127 06/12/17 0806 06/12/17 1416 06/13/17 0430  HGB 12.1  --   --   --  11.4*  --   --  11.7*  HCT 38.5  --   --   --  35.7*  --   --  37.8  PLT 189  --   --   --  182  --   --  180  APTT  --    < > 35  --  115*  --  85* 82*  LABPROT  --   --  17.1*  --  16.4*  --   --  14.5  INR  --   --  1.40  --  1.33  --   --  1.14  HEPARINUNFRC  --   --  1.87*  --   --   --   --  0.61  CREATININE 0.93  --   --   --  0.83  --   --   --   TROPONINI <0.03  --   --  0.04* 0.03* 0.03*  --   --    < > = values in this interval not displayed.   Estimated Creatinine Clearance: 34.7 mL/min (by C-G formula based on SCr of 0.83 mg/dL).  Assessment: Heparin level at goal Okay for Protocol, baseline aPTT and Heparin Level pending.  Last Xarelto at 8am 5/24.    Initial aPTT > goal.  No bleeding noted at this time.  CBC stable.  Goal of Therapy:  Goal INR 2-3 APTT goal 60-102 Heparin level 0.3-0.7 units/ml Monitor platelets by anticoagulation protocol: Yes   Plan:   Repeat warfarin 2.5mg  x1 dose today, as patient spit out dose yesterda Cntinue daily PT/INR  Continue heparin infusion rate at 650 units/hr Continue to monitor H&H and platelets Daily Heparin level   [place], [facility] 06/13/2017 11:55 AM

## 2017-06-13 NOTE — Progress Notes (Signed)
PROGRESS NOTE                                                                                                                                                                                                             Patient Demographics:    Theresa Woodard, is a 84 y.o. female, DOB - 27-Sep-1926, ZOX:[id number]  Admit date - Jun 12, 2017   Admitting Physician Levie Heritage, DO  Outpatient Primary MD for the patient is Ignatius Specking, MD  LOS - 2  Outpatient Specialists: None  Chief Complaint  Patient presents with  . Fall       Brief Narrative   84 year old female with history of stroke, hypertension, advanced dementia, history of PE about a year ago on Xarelto (does not appear to have missed a dose) presented with 4 episodes of fall in the past 6 days and complained of generalized weakness and back pain.  She has gotten weak and difficult to ambulate. In the ED she was hypertensive with stable remaining vitals.  A CT of the thoracic and lumbar spine showed chronic thoracic fractures with no acute findings.  CT of the abdomen and pelvis was unremarkable.  She then underwent CT angiogram of the chest with contrast which showed bilateral acute PE (L >R) with CT evidence of right heart strain.  Also found to have 4.3 cm ascending aortic aneurysm. Head CT was negative for acute findings. Patient started on IV heparin and admitted to stepdown unit.   Subjective:   Still quite confused, refused Coumadin yesterday and spit it out twice.  Complains of pain but unable to explain further.   Assessment  & Plan :    Principal Problem:   Pulmonary embolism, bilateral [facility]) Admitting hospitalist discussed with IR who recommended this is likely acute on chronic PE.  Given her advanced age and dementia with stable hemodynamic status is not a candidate for thrombolysis. IV heparin.  After discussing with hematologist on-call ( Dr  Ellin Saba), recommended starting her on Coumadin.  Pending 2D echo to evaluate right heart strain.    Doppler lower extremity to evaluate for DVTs.  Overall prognosis is quite guarded with her advanced age, severe dementia, deconditioning and submassive PE.  Given her recurrent falls recently I am not sure if she will be safe to be discharged on Coumadin.  I will  have PT evaluate her tomorrow. I will discuss with her husband today and have consulted palliative care for goals of care.  Transfer to telemetry.  Active Problems: Elevated troponin Mild. associated with clot burden.  Stable on telemetry.  Uncontrolled hypertension Continue home dose Toprol and lisinopril.  Added hydralazine.  History of CVA Stable.  Continue Depakote.     Dementia with behavioral disturbance, Continue low-dose Ativan for anxiety/agitation.  Allergic to Haldol.    Cardiac pacemaker in situ  Recurrent fall.  As above.  PT evaluation given increased fall risk and further evaluation on risk versus benefit of long-term anticoagulation.  Code Status : DNR  Family Communication  : None at bedside  Disposition Plan  : Pending clinical improvement, therapeutic INR, further work-up  Barriers For Discharge : Active symptoms  Consults  : Discussed with hematologist on the phone  Procedures  : CT head, CT angiogram chest, CT abdomen, CT thoracic and lumbar spine.  2D echo and Doppler lower extremity  DVT Prophylaxis  : IV heparin and Coumadin  Lab Results  Component Value Date   PLT 180 06/13/2017    Antibiotics  :   Anti-infectives (From admission, onward)   None        Objective:   Vitals:   06/13/17 0500 06/13/17 0600 06/13/17 0748 06/13/17 0938  BP: (!) 155/82 (!) 153/78  (!) 206/90  Pulse: (!) 52 (!) 54 (!) 53 72  Resp: 16 (!) 23 (!) 26   Temp:   98 F (36.7 C)   TempSrc:   Axillary   SpO2: 96% 95% 95%   Weight: 48.8 kg (107 lb 9.4 oz)     Height:        Wt Readings from Last 3  Encounters:  06/13/17 48.8 kg (107 lb 9.4 oz)  06/05/17 47.2 kg (104 lb)  01/10/16 47.3 kg (104 lb 3.2 oz)     Intake/Output Summary (Last 24 hours) at 06/13/2017 1047 Last data filed at 06/13/2017 0825 Gross per 24 hour  Intake 2692.25 ml  Output 950 ml  Net 1742.25 ml     Physical Exam  Gen: Restless, not in distress HEENT:  moist mucosa, supple neck Chest: clear b/l, no added sounds CVS: N S1&S2, no murmurs, GI: soft, NT, ND,  Musculoskeletal: warm, no edema     Data Review:    CBC Recent Labs  Lab 06/03/2017 1329 06/12/17 0127 06/13/17 0430  WBC 7.3 7.1 6.0  HGB 12.1 11.4* 11.7*  HCT 38.5 35.7* 37.8  PLT 189 182 180  MCV 94.8 94.2 95.2  MCH 29.8 30.1 29.5  MCHC 31.4 31.9 31.0  RDW 17.8* 17.9* 18.0*  LYMPHSABS 1.1  --   --   MONOABS 0.8  --   --   EOSABS 0.1  --   --   BASOSABS 0.0  --   --     Chemistries  Recent Labs  Lab 05/23/2017 1329 06/12/17 0127  NA 135 138  K 4.5 4.1  CL 102 105  CO2 26 26  GLUCOSE 95 90  BUN 19 15  CREATININE 0.93 0.83  CALCIUM 8.5* 8.3*  AST 22  --   ALT 12*  --   ALKPHOS 77  --   BILITOT 0.5  --    ------------------------------------------------------------------------------------------------------------------ No results for input(s): CHOL, HDL, LDLCALC, TRIG, CHOLHDL, LDLDIRECT in the last 72 hours.  No results found for: HGBA1C ------------------------------------------------------------------------------------------------------------------ No results for input(s): TSH, T4TOTAL, T3FREE, THYROIDAB in the last 72 hours.  Invalid input(s): FREET3 ------------------------------------------------------------------------------------------------------------------ No results for input(s): VITAMINB12, FOLATE, FERRITIN, TIBC, IRON, RETICCTPCT in the last 72 hours.  Coagulation profile Recent Labs  Lab Jul 11, 2017 1804 06/12/17 0127 06/13/17 0430  INR 1.40 1.33 1.14    No results for input(s): DDIMER in the last 72  hours.  Cardiac Enzymes Recent Labs  Lab 07-11-2017 1829 06/12/17 0127 06/12/17 0806  TROPONINI 0.04* 0.03* 0.03*   ------------------------------------------------------------------------------------------------------------------ No results found for: BNP  Inpatient Medications  Scheduled Meds: . divalproex  125 mg Oral Daily  . feeding supplement (ENSURE ENLIVE)  237 mL Oral Daily  . lisinopril  20 mg Oral Daily  . metoprolol succinate  25 mg Oral Daily  . multivitamin with minerals  1 tablet Oral Daily  . warfarin  2.5 mg Oral Once  . Warfarin - Pharmacist Dosing Inpatient   Does not apply Daily   Continuous Infusions: . sodium chloride 100 mL/hr at 06/13/17 0149  . heparin 650 Units/hr (06/13/17 0149)   PRN Meds:.acetaminophen **OR** acetaminophen, loperamide, LORazepam, LUBRIDERM DAILY MOISTURE, mineral oil-hydrophilic petrolatum, ondansetron **OR** ondansetron (ZOFRAN) IV  Micro Results Recent Results (from the past 240 hour(s))  MRSA PCR Screening     Status: None   Collection Time: 07/11/2017  6:30 PM  Result Value Ref Range Status   MRSA by PCR NEGATIVE NEGATIVE Final    Comment:        The GeneXpert MRSA Assay (FDA approved for NASAL specimens only), is one component of a comprehensive MRSA colonization surveillance program. It is not intended to diagnose MRSA infection nor to guide or monitor treatment for MRSA infections. Performed at [facility], [address], [place], [place] [address]     Radiology Reports Dg Chest 2 View  Result Date: 06/06/2017 CLINICAL DATA:  Status post unwitnessed fall, with upper back pain. Hypertension and atrial fibrillation. EXAM: CHEST - 2 VIEW COMPARISON:  Chest radiograph and CTA of the chest performed 04/17/2016 FINDINGS: The lungs are hypoexpanded. Mild left basilar airspace opacity may reflect atelectasis or mild pneumonia. There is no evidence of pleural effusion or pneumothorax. The heart is borderline enlarged. A  pacemaker is noted at the left chest wall, with leads ending at the right atrium and right ventricle. No acute osseous abnormalities are seen. There is chronic loss of height at the lower thoracic spine. IMPRESSION: 1. Lungs hypoexpanded. Mild left basilar airspace opacity may reflect atelectasis or mild pneumonia. Borderline cardiomegaly. 2. Chronic loss of height of the lower thoracic spine. Electronically Signed   By: Roanna Raider M.D.   On: 06/06/2017 00:11   Dg Pelvis 1-2 Views  Result Date: 06/06/2017 CLINICAL DATA:  Status post unwitnessed fall, with lower back pain. Concern for pelvic injury. Initial encounter. EXAM: PELVIS - 1-2 VIEW COMPARISON:  None. FINDINGS: There is no evidence of fracture or dislocation. Both femoral heads are seated normally within their respective acetabula. No significant degenerative change is appreciated. Sclerosis is noted at the sacroiliac joints bilaterally. The visualized bowel gas pattern is grossly unremarkable in appearance. Scattered postoperative change is noted about the pelvis. Vascular calcifications are seen. IMPRESSION: No evidence of fracture or dislocation. Electronically Signed   By: Roanna Raider M.D.   On: 06/06/2017 00:11   Ct Head Wo Contrast  Result Date: 2017-07-11 CLINICAL DATA:  Ataxia, multiple falls. EXAM: CT HEAD WITHOUT CONTRAST TECHNIQUE: Contiguous axial images were obtained from the base of the skull through the vertex without intravenous contrast. COMPARISON:  CT scan of Jun 05, 2017. FINDINGS:  Brain: Stable diffuse cortical atrophy is noted. Mild chronic ischemic white matter disease is noted. No mass effect or midline shift is noted. Ventricular size is within normal limits. There is no evidence of mass lesion, hemorrhage or acute infarction. Vascular: No hyperdense vessel or unexpected calcification. Skull: Normal. Negative for fracture or focal lesion. Sinuses/Orbits: No acute finding. Other: None. IMPRESSION: Stable diffuse  cortical atrophy. Mild chronic ischemic white matter disease. No acute intracranial abnormality seen. Electronically Signed   By: Lupita Raider, M.D.   On: July 02, 2017 16:41   Ct Head Wo Contrast  Result Date: 06/05/2017 CLINICAL DATA:  Status post unwitnessed fall. Concern for head or cervical spine injury. Hallucinations. EXAM: CT HEAD WITHOUT CONTRAST CT CERVICAL SPINE WITHOUT CONTRAST TECHNIQUE: Multidetector CT imaging of the head and cervical spine was performed following the standard protocol without intravenous contrast. Multiplanar CT image reconstructions of the cervical spine were also generated. COMPARISON:  CT of the head and cervical spine performed 04/16/2016 FINDINGS: CT HEAD FINDINGS Brain: No evidence of acute infarction, hemorrhage, hydrocephalus, extra-axial collection or mass lesion / mass effect. Prominence of the ventricles and sulci reflects moderately severe cortical volume loss. Cerebellar atrophy is noted. Scattered periventricular and subcortical white matter change likely reflects small vessel ischemic microangiopathy. A chronic lacunar infarct is noted at the left basal ganglia. The brainstem and fourth ventricle are within normal limits. The basal ganglia are unremarkable in appearance. The cerebral hemispheres demonstrate grossly normal gray-white differentiation. No mass effect or midline shift is seen. Vascular: No hyperdense vessel or unexpected calcification. Skull: There is no evidence of fracture; visualized osseous structures are unremarkable in appearance. Sinuses/Orbits: The visualized portions of the orbits are within normal limits. The paranasal sinuses and mastoid air cells are well-aerated. Other: No significant soft tissue abnormalities are seen. CT CERVICAL SPINE FINDINGS Alignment: There is grade 1 anterolisthesis of C3 on C4 and of C4 on C5, with underlying facet disease. Skull base and vertebrae: No acute fracture. No primary bone lesion or focal pathologic  process. Soft tissues and spinal canal: No prevertebral fluid or swelling. No visible canal hematoma. Disc levels: Multilevel disc space narrowing is noted along the cervical spine, with scattered anterior and posterior disc osteophyte complexes. Upper chest: The visualized lung apices are clear. The thyroid gland is unremarkable. Dense calcification is seen at the carotid bifurcations bilaterally, with likely mild luminal narrowing bilaterally. Other: No additional soft tissue abnormalities are seen. IMPRESSION: 1. No evidence of traumatic intracranial injury or fracture. 2. No evidence of acute fracture or subluxation along the cervical spine. 3. Moderately severe cortical volume loss and scattered small vessel ischemic microangiopathy. 4. Chronic lacunar infarct at the left basal ganglia. 5. Degenerative change noted along the cervical spine. 6. Dense calcification at the carotid bifurcations bilaterally, with likely mild luminal narrowing bilaterally. Carotid ultrasound would be helpful for further evaluation, when and as deemed clinically appropriate. Electronically Signed   By: Roanna Raider M.D.   On: 06/05/2017 23:51   Ct Angio Chest Pe W And/or Wo Contrast  Result Date: Jul 02, 2017 CLINICAL DATA:  fall last night. Staff reported pt complained of back pain but denies pain now. Husband reports 4 falls in 6 days and has been weak EXAM: CT ANGIOGRAPHY CHEST WITH CONTRAST TECHNIQUE: Multidetector CT imaging of the chest was performed using the standard protocol during bolus administration of intravenous contrast. Multiplanar CT image reconstructions and MIPs were obtained to evaluate the vascular anatomy. CONTRAST:  80mL ISOVUE-370 IOPAMIDOL (ISOVUE-370) INJECTION 76%  COMPARISON:  04/17/2016 FINDINGS: Cardiovascular: Moderate cardiomegaly. No pericardial effusion. Right ventricular dilatation. RV/LV ratio 1.5. Dilated central pulmonary arteries, present since 01/31/2015. There is a partially large central  filling defect at the bifurcation of the left pulmonary artery. Partially occlusive embolus in left lower lobe branch. Possible nonocclusive thrombus in a posterior right lower lobe branch although motion degrades images through the lung bases. Dilatation of the ascending aorta up to 4.3 cm diameter. There is incomplete opacification of the arch and descending thoracic aorta, limiting assessment for aortic dissection. Scattered coarse calcified plaque. Mediastinum/Nodes: No hilar or mediastinal adenopathy. Lungs/Pleura: Trace right pleural effusion. No pneumothorax. Minimal dependent atelectasis posteriorly in both lung bases. Lungs otherwise clear. Upper Abdomen: No acute findings Musculoskeletal: Interval T12 vertebral body cement augmentation. Stable compression deformities of T6, T7 and T11. Old healed sternal fracture. No acute fracture. Review of the MIP images confirms the above findings. IMPRESSION: 1. Positive for bilateral acute pulmonary emboli, left greater than right. CT evidence of right heart strain (RV/LV Ratio = 1.5) suggesting at least submassive (intermediate risk) PE. The presence of right heart strain has been associated with an increased risk of morbidity and mortality. Please activate Code PE by paging [phone]. Critical Value/emergent results were called by telephone at the time of interpretation on 29-Jun-2017 at 4:51 pm to Dr. Marily Memos , who verbally acknowledged these results. 2. 4.3 cm ascending aortic aneurysm. Recommend annual imaging followup by CTA or MRA. This recommendation follows 2010 ACCF/AHA/AATS/ACR/ASA/SCA/SCAI/SIR/STS/SVM Guidelines for the Diagnosis and Management of Patients with Thoracic Aortic Disease. Circulation. 2010; 121: W295-A213 Electronically Signed   By: Corlis Leak M.D.   On: June 29, 2017 16:52   Ct Cervical Spine Wo Contrast  Result Date: 06/05/2017 CLINICAL DATA:  Status post unwitnessed fall. Concern for head or cervical spine injury. Hallucinations.  EXAM: CT HEAD WITHOUT CONTRAST CT CERVICAL SPINE WITHOUT CONTRAST TECHNIQUE: Multidetector CT imaging of the head and cervical spine was performed following the standard protocol without intravenous contrast. Multiplanar CT image reconstructions of the cervical spine were also generated. COMPARISON:  CT of the head and cervical spine performed 04/16/2016 FINDINGS: CT HEAD FINDINGS Brain: No evidence of acute infarction, hemorrhage, hydrocephalus, extra-axial collection or mass lesion / mass effect. Prominence of the ventricles and sulci reflects moderately severe cortical volume loss. Cerebellar atrophy is noted. Scattered periventricular and subcortical white matter change likely reflects small vessel ischemic microangiopathy. A chronic lacunar infarct is noted at the left basal ganglia. The brainstem and fourth ventricle are within normal limits. The basal ganglia are unremarkable in appearance. The cerebral hemispheres demonstrate grossly normal gray-white differentiation. No mass effect or midline shift is seen. Vascular: No hyperdense vessel or unexpected calcification. Skull: There is no evidence of fracture; visualized osseous structures are unremarkable in appearance. Sinuses/Orbits: The visualized portions of the orbits are within normal limits. The paranasal sinuses and mastoid air cells are well-aerated. Other: No significant soft tissue abnormalities are seen. CT CERVICAL SPINE FINDINGS Alignment: There is grade 1 anterolisthesis of C3 on C4 and of C4 on C5, with underlying facet disease. Skull base and vertebrae: No acute fracture. No primary bone lesion or focal pathologic process. Soft tissues and spinal canal: No prevertebral fluid or swelling. No visible canal hematoma. Disc levels: Multilevel disc space narrowing is noted along the cervical spine, with scattered anterior and posterior disc osteophyte complexes. Upper chest: The visualized lung apices are clear. The thyroid gland is unremarkable.  Dense calcification is seen at the carotid bifurcations bilaterally, with  likely mild luminal narrowing bilaterally. Other: No additional soft tissue abnormalities are seen. IMPRESSION: 1. No evidence of traumatic intracranial injury or fracture. 2. No evidence of acute fracture or subluxation along the cervical spine. 3. Moderately severe cortical volume loss and scattered small vessel ischemic microangiopathy. 4. Chronic lacunar infarct at the left basal ganglia. 5. Degenerative change noted along the cervical spine. 6. Dense calcification at the carotid bifurcations bilaterally, with likely mild luminal narrowing bilaterally. Carotid ultrasound would be helpful for further evaluation, when and as deemed clinically appropriate. Electronically Signed   By: Roanna Raider M.D.   On: 06/05/2017 23:51   Ct Abdomen Pelvis W Contrast  Result Date: 05/27/2017 CLINICAL DATA:  Per ED notes: Pt brought in by EMS from [place] due to fall last night. Staff reported pt complained of back pain but denies pain now. Husband reports 4 falls in 6 days and has been weak EXAM: CT ABDOMEN AND PELVIS WITH CONTRAST TECHNIQUE: Multidetector CT imaging of the abdomen and pelvis was performed using the standard protocol following bolus administration of intravenous contrast. CONTRAST:  80mL ISOVUE-370 IOPAMIDOL (ISOVUE-370) INJECTION 76% COMPARISON:  Chest CTA, 04/17/2016 FINDINGS: Lower chest: See current chest CT. Hepatobiliary: No focal liver abnormality is seen. No gallstones, gallbladder wall thickening, or biliary dilatation. Pancreas: Unremarkable. No pancreatic ductal dilatation or surrounding inflammatory changes. Spleen: Normal size spleen. Heterogeneous early enhancement that becomes more homogeneous on the delayed image. No convincing splenic mass. Adrenals/Urinary Tract: No adrenal masses. Bilateral renal cortical thinning. Low-density mass arises from the cortex of the midpole the left kidney consistent with a cyst. No  stones. No hydronephrosis. Ureters are normal in course and in caliber. Bladder is unremarkable. Stomach/Bowel: Stomach is unremarkable. No evidence of bowel obstruction. No bowel wall thickening or inflammation. Anastomosis staple line is noted along the cecum consistent with a prior appendectomy. Vascular/Lymphatic: Extensive atherosclerosis noted throughout the abdominal aorta and its branch vessels. No pathologically enlarged lymph nodes. Reproductive: Status post hysterectomy. No adnexal masses. Other: Small fat containing infraumbilical midline hernia. No ascites. Musculoskeletal: Mild compression fracture of T11. Moderate compression fracture of T12. These are stable when compared to the prior chest CT. Sclerosis along the sacral ala is also consistent with old fractures. No new fractures. Bones are diffusely demineralized. No osteoblastic or osteolytic lesions. IMPRESSION: 1. No acute findings within the abdomen or pelvis. 2. Old fractures of T11, T12 and the sacral ala. Electronically Signed   By: Amie Portland M.D.   On: 06/18/2017 16:48   Ct T-spine No Charge  Result Date: 05/29/2017 CLINICAL DATA:  84 y/o  F; fall.  Evaluation of back pain. EXAM: CT THORACIC AND LUMBAR SPINE WITHOUT CONTRAST TECHNIQUE: Multidetector CT imaging of the thoracic and lumbar spine was performed without contrast. Multiplanar CT image reconstructions were also generated. COMPARISON:  04/17/2016 CT of the chest. Concurrent CT of chest, abdomen, and pelvis. FINDINGS: CT THORACIC SPINE FINDINGS Alignment: Moderate levocurvature of upper thoracic spine. Normal thoracic kyphosis without listhesis. Vertebrae: Stable mild T6 and T7 compression deformities. Stable mild T11 and moderate to severe T12 compression deformity. Paraspinal and other soft tissues: Please refer to the concurrent CT of the chest. Disc levels: Mild multilevel disc and facet degenerative changes. No significant bony foraminal or canal stenosis. CT LUMBAR SPINE  FINDINGS Segmentation: Mild lumbar levocurvature with apex at L3. Alignment: Normal lumbar lordosis without listhesis. Vertebrae: No acute fracture or focal pathologic process. Paraspinal and other soft tissues: Please refer to concurrent CT of the abdomen  and pelvis. Disc levels: Mild L1-2 and severe L2-L4 loss of disc space height. Multilevel facet hypertrophy. No high-grade bony canal stenosis. IMPRESSION: CT THORACIC SPINE IMPRESSION 1. Stable chronic T6, T7, T11, and T12 compression deformities. 2. No acute fracture identified. CT LUMBAR SPINE IMPRESSION No acute fracture identified. Electronically Signed   By: Mitzi Hansen M.D.   On: 05/29/2017 16:55   Ct L-spine No Charge  Result Date: 06/12/2017 CLINICAL DATA:  84 y/o  F; fall.  Evaluation of back pain. EXAM: CT THORACIC AND LUMBAR SPINE WITHOUT CONTRAST TECHNIQUE: Multidetector CT imaging of the thoracic and lumbar spine was performed without contrast. Multiplanar CT image reconstructions were also generated. COMPARISON:  04/17/2016 CT of the chest. Concurrent CT of chest, abdomen, and pelvis. FINDINGS: CT THORACIC SPINE FINDINGS Alignment: Moderate levocurvature of upper thoracic spine. Normal thoracic kyphosis without listhesis. Vertebrae: Stable mild T6 and T7 compression deformities. Stable mild T11 and moderate to severe T12 compression deformity. Paraspinal and other soft tissues: Please refer to the concurrent CT of the chest. Disc levels: Mild multilevel disc and facet degenerative changes. No significant bony foraminal or canal stenosis. CT LUMBAR SPINE FINDINGS Segmentation: Mild lumbar levocurvature with apex at L3. Alignment: Normal lumbar lordosis without listhesis. Vertebrae: No acute fracture or focal pathologic process. Paraspinal and other soft tissues: Please refer to concurrent CT of the abdomen and pelvis. Disc levels: Mild L1-2 and severe L2-L4 loss of disc space height. Multilevel facet hypertrophy. No high-grade bony  canal stenosis. IMPRESSION: CT THORACIC SPINE IMPRESSION 1. Stable chronic T6, T7, T11, and T12 compression deformities. 2. No acute fracture identified. CT LUMBAR SPINE IMPRESSION No acute fracture identified. Electronically Signed   By: Mitzi Hansen M.D.   On: 06/03/2017 16:55    Time Spent in minutes  25   Zeb Rawl M.D on 06/13/2017 at 10:47 AM  Between 7am to 7pm - Pager - [phone]  After 7pm go to [email or web address] - password [facility]  [facility] -  Office  [phone]

## 2017-06-13 NOTE — Progress Notes (Signed)
*  PRELIMINARY RESULTS* Echocardiogram 2D Echocardiogram has been performed.  Jeryl Columbia 06/13/2017, 8:54 AM

## 2017-06-14 ENCOUNTER — Inpatient Hospital Stay (HOSPITAL_COMMUNITY): Payer: PPO

## 2017-06-14 ENCOUNTER — Encounter (HOSPITAL_COMMUNITY): Payer: Self-pay | Admitting: Primary Care

## 2017-06-14 DIAGNOSIS — I2699 Other pulmonary embolism without acute cor pulmonale: Principal | ICD-10-CM

## 2017-06-14 DIAGNOSIS — I69319 Unspecified symptoms and signs involving cognitive functions following cerebral infarction: Secondary | ICD-10-CM

## 2017-06-14 DIAGNOSIS — R410 Disorientation, unspecified: Secondary | ICD-10-CM

## 2017-06-14 DIAGNOSIS — Z7189 Other specified counseling: Secondary | ICD-10-CM

## 2017-06-14 DIAGNOSIS — Z515 Encounter for palliative care: Secondary | ICD-10-CM

## 2017-06-14 LAB — CBC
HEMATOCRIT: 40.1 % (ref 36.0–46.0)
Hemoglobin: 12.4 g/dL (ref 12.0–15.0)
MCH: 29.3 pg (ref 26.0–34.0)
MCHC: 30.9 g/dL (ref 30.0–36.0)
MCV: 94.8 fL (ref 78.0–100.0)
Platelets: 199 10*3/uL (ref 150–400)
RBC: 4.23 MIL/uL (ref 3.87–5.11)
RDW: 18.1 % — AB (ref 11.5–15.5)
WBC: 7.4 10*3/uL (ref 4.0–10.5)

## 2017-06-14 LAB — PROTIME-INR
INR: 1.14
PROTHROMBIN TIME: 14.6 s (ref 11.4–15.2)

## 2017-06-14 LAB — HEPARIN LEVEL (UNFRACTIONATED): Heparin Unfractionated: 0.3 IU/mL (ref 0.30–0.70)

## 2017-06-14 LAB — APTT: aPTT: 76 seconds — ABNORMAL HIGH (ref 24–36)

## 2017-06-14 MED ORDER — WARFARIN SODIUM 2 MG PO TABS
4.0000 mg | ORAL_TABLET | Freq: Once | ORAL | Status: AC
  Administered 2017-06-14: 4 mg via ORAL
  Filled 2017-06-14: qty 2

## 2017-06-14 NOTE — Progress Notes (Signed)
PROGRESS NOTE                                                                                                                                                                                                             Patient Demographics:    Theresa Woodard, is a 84 y.o. female, DOB - 1926/10/07, ZOX:[id number]  Admit date - 06/08/2017   Admitting Physician Levie Heritage, DO  Outpatient Primary MD for the patient is Ignatius Specking, MD  LOS - 3  Outpatient Specialists: None  Chief Complaint  Patient presents with  . Fall       Brief Narrative   84 year old female with history of stroke, hypertension, advanced dementia, history of PE about a year ago on Xarelto (does not appear to have missed a dose) presented with 4 episodes of fall in the past 6 days and complained of generalized weakness and back pain.  She has gotten weak and difficult to ambulate. In the ED she was hypertensive with stable remaining vitals.  A CT of the thoracic and lumbar spine showed chronic thoracic fractures with no acute findings.  CT of the abdomen and pelvis was unremarkable.  She then underwent CT angiogram of the chest with contrast which showed bilateral acute PE (L >R) with CT evidence of right heart strain.  Also found to have 4.3 cm ascending aortic aneurysm. Head CT was negative for acute findings. Patient started on IV heparin and admitted to stepdown unit.   Subjective:   Patient continues to be restless and confused.  Constantly yelling and moaning in bed.   Assessment  & Plan :    Principal Problem:   Pulmonary embolism, bilateral [facility]) Admitting hospitalist discussed with IR who recommended this is likely acute on chronic PE.  Given her advanced age and dementia with stable hemodynamic status is not a candidate for thrombolysis.  EF of 65-70% on echo with moderate LVH, grade 1 diastolic dysfunction and elevated LV filling pressure  with no significant right heart strain. No DVT seen on right lower extremity.  (Left lower extremity could not be performed) Patient on IV heparin and started Coumadin after discussion with hematologist on the phone. Overall prognosis is extremely guarded with advanced age, severe dementia and frequent falls which puts her at high risk for bleeding. Palliative care consulted for goals of  care discussion.  Husband is agreeing on residential hospice.  Active Problems: Elevated troponin Mild. associated with clot burden.  Stable on telemetry.  Uncontrolled hypertension Continue home dose Toprol and lisinopril.  IV hydralazine PRN.  History of CVA Stable.  Continue Depakote.     Dementia with behavioral disturbance, Continue low-dose Ativan for anxiety/agitation.  Allergic to Haldol.    Cardiac pacemaker in situ  Recurrent fall.  As above.  PT evaluation given increased fall risk and further evaluation on risk versus benefit of long-term anticoagulation.  Code Status : DNR  Family Communication  : None at bedside  Disposition Plan  : Possible residential hospice tomorrow after further discussion with husband.  Prognosis is guarded.  Barriers For Discharge : Active symptoms  Consults  : Discussed with hematologist on the phone  Procedures  : CT head, CT angiogram chest, CT abdomen, CT thoracic and lumbar spine.  2D echo and Doppler lower extremity  DVT Prophylaxis  : IV heparin and Coumadin  Lab Results  Component Value Date   PLT 199 06/14/2017    Antibiotics  :   Anti-infectives (From admission, onward)   None        Objective:   Vitals:   06/13/17 2041 06/14/17 0527 06/14/17 0559 06/14/17 1021  BP: (!) 178/98 (!) 217/92 (!) 118/58 (!) 189/124  Pulse: 72 (!) 57 68 75  Resp: 18  18   Temp: (!) 97.5 F (36.4 C)     TempSrc: Oral     SpO2: 96%     Weight:      Height:        Wt Readings from Last 3 Encounters:  06/13/17 48.8 kg (107 lb 9.4 oz)  06/05/17  47.2 kg (104 lb)  01/10/16 47.3 kg (104 lb 3.2 oz)     Intake/Output Summary (Last 24 hours) at 06/14/2017 1340 Last data filed at 06/14/2017 1037 Gross per 24 hour  Intake 2809.5 ml  Output 1350 ml  Net 1459.5 ml   Physical  exam Restless, moaning in bed, HEENT: Moist mucosa, supple neck Chest: Clear bilaterally CVs: Normal S1-S2, no murmurs GI: Soft, nondistended, nontender Musculoskeletal: Warm, no edema    Data Review:    CBC Recent Labs  Lab 05/26/2017 1329 06/12/17 0127 06/13/17 0430 06/14/17 0625  WBC 7.3 7.1 6.0 7.4  HGB 12.1 11.4* 11.7* 12.4  HCT 38.5 35.7* 37.8 40.1  PLT 189 182 180 199  MCV 94.8 94.2 95.2 94.8  MCH 29.8 30.1 29.5 29.3  MCHC 31.4 31.9 31.0 30.9  RDW 17.8* 17.9* 18.0* 18.1*  LYMPHSABS 1.1  --   --   --   MONOABS 0.8  --   --   --   EOSABS 0.1  --   --   --   BASOSABS 0.0  --   --   --     Chemistries  Recent Labs  Lab 06/08/2017 1329 06/12/17 0127  NA 135 138  K 4.5 4.1  CL 102 105  CO2 26 26  GLUCOSE 95 90  BUN 19 15  CREATININE 0.93 0.83  CALCIUM 8.5* 8.3*  AST 22  --   ALT 12*  --   ALKPHOS 77  --   BILITOT 0.5  --    ------------------------------------------------------------------------------------------------------------------ No results for input(s): CHOL, HDL, LDLCALC, TRIG, CHOLHDL, LDLDIRECT in the last 72 hours.  No results found for: HGBA1C ------------------------------------------------------------------------------------------------------------------ No results for input(s): TSH, T4TOTAL, T3FREE, THYROIDAB in the last 72 hours.  Invalid input(s): FREET3 ------------------------------------------------------------------------------------------------------------------  No results for input(s): VITAMINB12, FOLATE, FERRITIN, TIBC, IRON, RETICCTPCT in the last 72 hours.  Coagulation profile Recent Labs  Lab 08-Jul-2017 1804 06/12/17 0127 06/13/17 0430 06/14/17 0625  INR 1.40 1.33 1.14 1.14    No results for  input(s): DDIMER in the last 72 hours.  Cardiac Enzymes Recent Labs  Lab 08-Jul-2017 1829 06/12/17 0127 06/12/17 0806  TROPONINI 0.04* 0.03* 0.03*   ------------------------------------------------------------------------------------------------------------------ No results found for: BNP  Inpatient Medications  Scheduled Meds: . divalproex  125 mg Oral Daily  . feeding supplement (ENSURE ENLIVE)  237 mL Oral Daily  . lisinopril  20 mg Oral Daily  . metoprolol succinate  50 mg Oral Daily  . multivitamin with minerals  1 tablet Oral Daily  . warfarin  4 mg Oral Once  . Warfarin - Pharmacist Dosing Inpatient   Does not apply Daily   Continuous Infusions: . sodium chloride 100 mL/hr at 06/14/17 0720  . heparin 650 Units/hr (06/13/17 0149)   PRN Meds:.acetaminophen **OR** acetaminophen, hydrALAZINE, loperamide, LORazepam, LUBRIDERM DAILY MOISTURE, mineral oil-hydrophilic petrolatum, ondansetron **OR** ondansetron (ZOFRAN) IV  Micro Results Recent Results (from the past 240 hour(s))  MRSA PCR Screening     Status: None   Collection Time: 07/08/2017  6:30 PM  Result Value Ref Range Status   MRSA by PCR NEGATIVE NEGATIVE Final    Comment:        The GeneXpert MRSA Assay (FDA approved for NASAL specimens only), is one component of a comprehensive MRSA colonization surveillance program. It is not intended to diagnose MRSA infection nor to guide or monitor treatment for MRSA infections. Performed at [facility], [address] [place], [place] [address]     Radiology Reports Dg Chest 2 View  Result Date: 06/06/2017 CLINICAL DATA:  Status post unwitnessed fall, with upper back pain. Hypertension and atrial fibrillation. EXAM: CHEST - 2 VIEW COMPARISON:  Chest radiograph and CTA of the chest performed 04/17/2016 FINDINGS: The lungs are hypoexpanded. Mild left basilar airspace opacity may reflect atelectasis or mild pneumonia. There is no evidence of pleural effusion or  pneumothorax. The heart is borderline enlarged. A pacemaker is noted at the left chest wall, with leads ending at the right atrium and right ventricle. No acute osseous abnormalities are seen. There is chronic loss of height at the lower thoracic spine. IMPRESSION: 1. Lungs hypoexpanded. Mild left basilar airspace opacity may reflect atelectasis or mild pneumonia. Borderline cardiomegaly. 2. Chronic loss of height of the lower thoracic spine. Electronically Signed   By: Roanna Raider M.D.   On: 06/06/2017 00:11   Dg Pelvis 1-2 Views  Result Date: 06/06/2017 CLINICAL DATA:  Status post unwitnessed fall, with lower back pain. Concern for pelvic injury. Initial encounter. EXAM: PELVIS - 1-2 VIEW COMPARISON:  None. FINDINGS: There is no evidence of fracture or dislocation. Both femoral heads are seated normally within their respective acetabula. No significant degenerative change is appreciated. Sclerosis is noted at the sacroiliac joints bilaterally. The visualized bowel gas pattern is grossly unremarkable in appearance. Scattered postoperative change is noted about the pelvis. Vascular calcifications are seen. IMPRESSION: No evidence of fracture or dislocation. Electronically Signed   By: Roanna Raider M.D.   On: 06/06/2017 00:11   Ct Head Wo Contrast  Result Date: 07-08-2017 CLINICAL DATA:  Ataxia, multiple falls. EXAM: CT HEAD WITHOUT CONTRAST TECHNIQUE: Contiguous axial images were obtained from the base of the skull through the vertex without intravenous contrast. COMPARISON:  CT scan of Jun 05, 2017. FINDINGS:  Brain: Stable diffuse cortical atrophy is noted. Mild chronic ischemic white matter disease is noted. No mass effect or midline shift is noted. Ventricular size is within normal limits. There is no evidence of mass lesion, hemorrhage or acute infarction. Vascular: No hyperdense vessel or unexpected calcification. Skull: Normal. Negative for fracture or focal lesion. Sinuses/Orbits: No acute  finding. Other: None. IMPRESSION: Stable diffuse cortical atrophy. Mild chronic ischemic white matter disease. No acute intracranial abnormality seen. Electronically Signed   By: Lupita Raider, M.D.   On: Jun 22, 2017 16:41   Ct Head Wo Contrast  Result Date: 06/05/2017 CLINICAL DATA:  Status post unwitnessed fall. Concern for head or cervical spine injury. Hallucinations. EXAM: CT HEAD WITHOUT CONTRAST CT CERVICAL SPINE WITHOUT CONTRAST TECHNIQUE: Multidetector CT imaging of the head and cervical spine was performed following the standard protocol without intravenous contrast. Multiplanar CT image reconstructions of the cervical spine were also generated. COMPARISON:  CT of the head and cervical spine performed 04/16/2016 FINDINGS: CT HEAD FINDINGS Brain: No evidence of acute infarction, hemorrhage, hydrocephalus, extra-axial collection or mass lesion / mass effect. Prominence of the ventricles and sulci reflects moderately severe cortical volume loss. Cerebellar atrophy is noted. Scattered periventricular and subcortical white matter change likely reflects small vessel ischemic microangiopathy. A chronic lacunar infarct is noted at the left basal ganglia. The brainstem and fourth ventricle are within normal limits. The basal ganglia are unremarkable in appearance. The cerebral hemispheres demonstrate grossly normal gray-white differentiation. No mass effect or midline shift is seen. Vascular: No hyperdense vessel or unexpected calcification. Skull: There is no evidence of fracture; visualized osseous structures are unremarkable in appearance. Sinuses/Orbits: The visualized portions of the orbits are within normal limits. The paranasal sinuses and mastoid air cells are well-aerated. Other: No significant soft tissue abnormalities are seen. CT CERVICAL SPINE FINDINGS Alignment: There is grade 1 anterolisthesis of C3 on C4 and of C4 on C5, with underlying facet disease. Skull base and vertebrae: No acute  fracture. No primary bone lesion or focal pathologic process. Soft tissues and spinal canal: No prevertebral fluid or swelling. No visible canal hematoma. Disc levels: Multilevel disc space narrowing is noted along the cervical spine, with scattered anterior and posterior disc osteophyte complexes. Upper chest: The visualized lung apices are clear. The thyroid gland is unremarkable. Dense calcification is seen at the carotid bifurcations bilaterally, with likely mild luminal narrowing bilaterally. Other: No additional soft tissue abnormalities are seen. IMPRESSION: 1. No evidence of traumatic intracranial injury or fracture. 2. No evidence of acute fracture or subluxation along the cervical spine. 3. Moderately severe cortical volume loss and scattered small vessel ischemic microangiopathy. 4. Chronic lacunar infarct at the left basal ganglia. 5. Degenerative change noted along the cervical spine. 6. Dense calcification at the carotid bifurcations bilaterally, with likely mild luminal narrowing bilaterally. Carotid ultrasound would be helpful for further evaluation, when and as deemed clinically appropriate. Electronically Signed   By: Roanna Raider M.D.   On: 06/05/2017 23:51   Ct Angio Chest Pe W And/or Wo Contrast  Result Date: June 22, 2017 CLINICAL DATA:  fall last night. Staff reported pt complained of back pain but denies pain now. Husband reports 4 falls in 6 days and has been weak EXAM: CT ANGIOGRAPHY CHEST WITH CONTRAST TECHNIQUE: Multidetector CT imaging of the chest was performed using the standard protocol during bolus administration of intravenous contrast. Multiplanar CT image reconstructions and MIPs were obtained to evaluate the vascular anatomy. CONTRAST:  80mL ISOVUE-370 IOPAMIDOL (ISOVUE-370) INJECTION 76%  COMPARISON:  04/17/2016 FINDINGS: Cardiovascular: Moderate cardiomegaly. No pericardial effusion. Right ventricular dilatation. RV/LV ratio 1.5. Dilated central pulmonary arteries, present  since 01/31/2015. There is a partially large central filling defect at the bifurcation of the left pulmonary artery. Partially occlusive embolus in left lower lobe branch. Possible nonocclusive thrombus in a posterior right lower lobe branch although motion degrades images through the lung bases. Dilatation of the ascending aorta up to 4.3 cm diameter. There is incomplete opacification of the arch and descending thoracic aorta, limiting assessment for aortic dissection. Scattered coarse calcified plaque. Mediastinum/Nodes: No hilar or mediastinal adenopathy. Lungs/Pleura: Trace right pleural effusion. No pneumothorax. Minimal dependent atelectasis posteriorly in both lung bases. Lungs otherwise clear. Upper Abdomen: No acute findings Musculoskeletal: Interval T12 vertebral body cement augmentation. Stable compression deformities of T6, T7 and T11. Old healed sternal fracture. No acute fracture. Review of the MIP images confirms the above findings. IMPRESSION: 1. Positive for bilateral acute pulmonary emboli, left greater than right. CT evidence of right heart strain (RV/LV Ratio = 1.5) suggesting at least submassive (intermediate risk) PE. The presence of right heart strain has been associated with an increased risk of morbidity and mortality. Please activate Code PE by paging [phone]. Critical Value/emergent results were called by telephone at the time of interpretation on Jul 03, 2017 at 4:51 pm to Dr. Marily Memos , who verbally acknowledged these results. 2. 4.3 cm ascending aortic aneurysm. Recommend annual imaging followup by CTA or MRA. This recommendation follows 2010 ACCF/AHA/AATS/ACR/ASA/SCA/SCAI/SIR/STS/SVM Guidelines for the Diagnosis and Management of Patients with Thoracic Aortic Disease. Circulation. 2010; 121: F621-H086 Electronically Signed   By: Corlis Leak M.D.   On: 07/03/2017 16:52   Ct Cervical Spine Wo Contrast  Result Date: 06/05/2017 CLINICAL DATA:  Status post unwitnessed fall. Concern  for head or cervical spine injury. Hallucinations. EXAM: CT HEAD WITHOUT CONTRAST CT CERVICAL SPINE WITHOUT CONTRAST TECHNIQUE: Multidetector CT imaging of the head and cervical spine was performed following the standard protocol without intravenous contrast. Multiplanar CT image reconstructions of the cervical spine were also generated. COMPARISON:  CT of the head and cervical spine performed 04/16/2016 FINDINGS: CT HEAD FINDINGS Brain: No evidence of acute infarction, hemorrhage, hydrocephalus, extra-axial collection or mass lesion / mass effect. Prominence of the ventricles and sulci reflects moderately severe cortical volume loss. Cerebellar atrophy is noted. Scattered periventricular and subcortical white matter change likely reflects small vessel ischemic microangiopathy. A chronic lacunar infarct is noted at the left basal ganglia. The brainstem and fourth ventricle are within normal limits. The basal ganglia are unremarkable in appearance. The cerebral hemispheres demonstrate grossly normal gray-white differentiation. No mass effect or midline shift is seen. Vascular: No hyperdense vessel or unexpected calcification. Skull: There is no evidence of fracture; visualized osseous structures are unremarkable in appearance. Sinuses/Orbits: The visualized portions of the orbits are within normal limits. The paranasal sinuses and mastoid air cells are well-aerated. Other: No significant soft tissue abnormalities are seen. CT CERVICAL SPINE FINDINGS Alignment: There is grade 1 anterolisthesis of C3 on C4 and of C4 on C5, with underlying facet disease. Skull base and vertebrae: No acute fracture. No primary bone lesion or focal pathologic process. Soft tissues and spinal canal: No prevertebral fluid or swelling. No visible canal hematoma. Disc levels: Multilevel disc space narrowing is noted along the cervical spine, with scattered anterior and posterior disc osteophyte complexes. Upper chest: The visualized lung  apices are clear. The thyroid gland is unremarkable. Dense calcification is seen at the carotid bifurcations bilaterally, with  likely mild luminal narrowing bilaterally. Other: No additional soft tissue abnormalities are seen. IMPRESSION: 1. No evidence of traumatic intracranial injury or fracture. 2. No evidence of acute fracture or subluxation along the cervical spine. 3. Moderately severe cortical volume loss and scattered small vessel ischemic microangiopathy. 4. Chronic lacunar infarct at the left basal ganglia. 5. Degenerative change noted along the cervical spine. 6. Dense calcification at the carotid bifurcations bilaterally, with likely mild luminal narrowing bilaterally. Carotid ultrasound would be helpful for further evaluation, when and as deemed clinically appropriate. Electronically Signed   By: Roanna Raider M.D.   On: 06/05/2017 23:51   Ct Abdomen Pelvis W Contrast  Result Date: 05/21/2017 CLINICAL DATA:  Per ED notes: Pt brought in by EMS from [place] due to fall last night. Staff reported pt complained of back pain but denies pain now. Husband reports 4 falls in 6 days and has been weak EXAM: CT ABDOMEN AND PELVIS WITH CONTRAST TECHNIQUE: Multidetector CT imaging of the abdomen and pelvis was performed using the standard protocol following bolus administration of intravenous contrast. CONTRAST:  80mL ISOVUE-370 IOPAMIDOL (ISOVUE-370) INJECTION 76% COMPARISON:  Chest CTA, 04/17/2016 FINDINGS: Lower chest: See current chest CT. Hepatobiliary: No focal liver abnormality is seen. No gallstones, gallbladder wall thickening, or biliary dilatation. Pancreas: Unremarkable. No pancreatic ductal dilatation or surrounding inflammatory changes. Spleen: Normal size spleen. Heterogeneous early enhancement that becomes more homogeneous on the delayed image. No convincing splenic mass. Adrenals/Urinary Tract: No adrenal masses. Bilateral renal cortical thinning. Low-density mass arises from the cortex of  the midpole the left kidney consistent with a cyst. No stones. No hydronephrosis. Ureters are normal in course and in caliber. Bladder is unremarkable. Stomach/Bowel: Stomach is unremarkable. No evidence of bowel obstruction. No bowel wall thickening or inflammation. Anastomosis staple line is noted along the cecum consistent with a prior appendectomy. Vascular/Lymphatic: Extensive atherosclerosis noted throughout the abdominal aorta and its branch vessels. No pathologically enlarged lymph nodes. Reproductive: Status post hysterectomy. No adnexal masses. Other: Small fat containing infraumbilical midline hernia. No ascites. Musculoskeletal: Mild compression fracture of T11. Moderate compression fracture of T12. These are stable when compared to the prior chest CT. Sclerosis along the sacral ala is also consistent with old fractures. No new fractures. Bones are diffusely demineralized. No osteoblastic or osteolytic lesions. IMPRESSION: 1. No acute findings within the abdomen or pelvis. 2. Old fractures of T11, T12 and the sacral ala. Electronically Signed   By: Amie Portland M.D.   On: 06/03/2017 16:48   US Venous Img Lower Bilateral  Result Date: 06/14/2017 CLINICAL DATA:  Pulmonary embolism. EXAM: BILATERAL LOWER EXTREMITY VENOUS DUPLEX ULTRASOUND TECHNIQUE: Doppler venous assessment of the bilateral lower extremity deep venous system was attempted, including characterization of spectral flow, compressibility, and phasicity. The patient was combative and only a limited examination of the right lower extremity venous system was performed. Examination of the left lower extremity venous system was not performed. The nurse was unable to give additional medications according to the ultrasound technologist. COMPARISON:  None. FINDINGS: There is grossly complete compressibility of the visualized portions of the right common femoral, femoral, and popliteal veins. Doppler analysis demonstrates respiratory phasicity and  augmentation of flow with calf compression. Evaluation of the calf was not performed. Evaluation of the deep venous system of the left lower extremity was not performed. IMPRESSION: Limited examination. No obvious DVT in the right lower extremity. Evaluation of the left lower extremity was not performed. Electronically Signed   By: Jolaine Click  M.D.   On: 06/14/2017 10:16   Ct T-spine No Charge  Result Date: 08-Jul-2017 CLINICAL DATA:  84 y/o  F; fall.  Evaluation of back pain. EXAM: CT THORACIC AND LUMBAR SPINE WITHOUT CONTRAST TECHNIQUE: Multidetector CT imaging of the thoracic and lumbar spine was performed without contrast. Multiplanar CT image reconstructions were also generated. COMPARISON:  04/17/2016 CT of the chest. Concurrent CT of chest, abdomen, and pelvis. FINDINGS: CT THORACIC SPINE FINDINGS Alignment: Moderate levocurvature of upper thoracic spine. Normal thoracic kyphosis without listhesis. Vertebrae: Stable mild T6 and T7 compression deformities. Stable mild T11 and moderate to severe T12 compression deformity. Paraspinal and other soft tissues: Please refer to the concurrent CT of the chest. Disc levels: Mild multilevel disc and facet degenerative changes. No significant bony foraminal or canal stenosis. CT LUMBAR SPINE FINDINGS Segmentation: Mild lumbar levocurvature with apex at L3. Alignment: Normal lumbar lordosis without listhesis. Vertebrae: No acute fracture or focal pathologic process. Paraspinal and other soft tissues: Please refer to concurrent CT of the abdomen and pelvis. Disc levels: Mild L1-2 and severe L2-L4 loss of disc space height. Multilevel facet hypertrophy. No high-grade bony canal stenosis. IMPRESSION: CT THORACIC SPINE IMPRESSION 1. Stable chronic T6, T7, T11, and T12 compression deformities. 2. No acute fracture identified. CT LUMBAR SPINE IMPRESSION No acute fracture identified. Electronically Signed   By: Mitzi Hansen M.D.   On: 07/08/2017 16:55   Ct  L-spine No Charge  Result Date: July 08, 2017 CLINICAL DATA:  84 y/o  F; fall.  Evaluation of back pain. EXAM: CT THORACIC AND LUMBAR SPINE WITHOUT CONTRAST TECHNIQUE: Multidetector CT imaging of the thoracic and lumbar spine was performed without contrast. Multiplanar CT image reconstructions were also generated. COMPARISON:  04/17/2016 CT of the chest. Concurrent CT of chest, abdomen, and pelvis. FINDINGS: CT THORACIC SPINE FINDINGS Alignment: Moderate levocurvature of upper thoracic spine. Normal thoracic kyphosis without listhesis. Vertebrae: Stable mild T6 and T7 compression deformities. Stable mild T11 and moderate to severe T12 compression deformity. Paraspinal and other soft tissues: Please refer to the concurrent CT of the chest. Disc levels: Mild multilevel disc and facet degenerative changes. No significant bony foraminal or canal stenosis. CT LUMBAR SPINE FINDINGS Segmentation: Mild lumbar levocurvature with apex at L3. Alignment: Normal lumbar lordosis without listhesis. Vertebrae: No acute fracture or focal pathologic process. Paraspinal and other soft tissues: Please refer to concurrent CT of the abdomen and pelvis. Disc levels: Mild L1-2 and severe L2-L4 loss of disc space height. Multilevel facet hypertrophy. No high-grade bony canal stenosis. IMPRESSION: CT THORACIC SPINE IMPRESSION 1. Stable chronic T6, T7, T11, and T12 compression deformities. 2. No acute fracture identified. CT LUMBAR SPINE IMPRESSION No acute fracture identified. Electronically Signed   By: Mitzi Hansen M.D.   On: 2017/07/08 16:55    Time Spent in minutes  25   Milah Recht M.D on 06/14/2017 at 1:40 PM  Between 7am to 7pm - Pager - [phone]  After 7pm go to [email or web address] - password [facility]  [facility] -  Office  [phone]

## 2017-06-14 NOTE — Progress Notes (Signed)
ANTICOAGULATION CONSULT NOTE  Pharmacy Consult for heparin>>warfarin Indication: PE  Allergies  Allergen Reactions  . Ativan [Lorazepam]   . Haldol [Haloperidol Lactate]    Patient Measurements: Height:  (154.9 cm) Weight: 107 lb 9.4 oz (48.8 kg) IBW/kg (Calculated) : 47.8 HEPARIN DW (KG): 47.2   Vital Signs: Temp: 97.5 F (36.4 C) (05/26 2041) Temp Source: Oral (05/26 2041) BP: 118/58 (05/27 0559) Pulse Rate: 68 (05/27 0559)  Labs: Recent Labs    05/29/2017 1329  06/03/2017 1804 06/03/2017 1829 06/12/17 0127 06/12/17 0806 06/12/17 1416 06/13/17 0430 06/14/17 0625  HGB 12.1  --   --   --  11.4*  --   --  11.7* 12.4  HCT 38.5  --   --   --  35.7*  --   --  37.8 40.1  PLT 189  --   --   --  182  --   --  180 199  APTT  --    < > 35  --  115*  --  85* 82* 76*  LABPROT  --    < > 17.1*  --  16.4*  --   --  14.5 14.6  INR  --    < > 1.40  --  1.33  --   --  1.14 1.14  HEPARINUNFRC  --   --  1.87*  --   --   --   --  0.61 0.30  CREATININE 0.93  --   --   --  0.83  --   --   --   --   TROPONINI <0.03  --   --  0.04* 0.03* 0.03*  --   --   --    < > = values in this interval not displayed.   Estimated Creatinine Clearance: 34.7 mL/min (by C-G formula based on SCr of 0.83 mg/dL).  Assessment: Heparin level at goal.  INR remains unchanged.   Okay for Protocol, last Xarelto at 8am 5/24.    No bleeding noted at this time.  CBC stable.  Goal of Therapy:  Goal INR 2-3 Heparin level 0.3-0.7 units/ml Monitor platelets by anticoagulation protocol: Yes   Plan:  Warfarin  x1 dose today Cntinue daily PT/INR  Continue heparin infusion rate at 650 units/hr Continue to monitor H&H and platelets Daily Heparin level  Mady Gemma, [facility] 06/14/2017 7:32 AM

## 2017-06-14 NOTE — Consult Note (Signed)
Consultation Note Date: 06/14/2017   Patient Name: Theresa Woodard  DOB: 1926/07/06  MRN: [id number]  Age / Sex: 84 y.o., female  PCP: Ignatius Specking, MD Referring Physician: Eddie North, MD  Reason for Consultation: Establishing goals of care  HPI/Patient Profile: 84 y.o. female  with past medical history of  dementia, stroke in 2012 with a gradual decline in mental and physical abilities, history of PE 1 year ago, hypertension, pacemaker multiple falls with multiple fractures, cancer '98, ALF resident for approximately 2 years admitted on 06/05/2017 with bilateral PE.   Clinical Assessment and Goals of Care: I have reviewed medical records including EPIC notes, labs and imaging, received report from nursing staff, assessed the patient.  Theresa Woodard is resting quietly in bed.  She will not open her eyes and look at me or interact in any meaningful way.  Only meeting with husband [name] in the waiting area per his wishes to discuss diagnosis prognosis, GOC, EOL wishes, disposition and options.   I introduced Palliative Medicine as specialized medical care for people living with serious illness. It focuses on providing relief from the symptoms and stress of a serious illness. The goal is to improve quality of life for both the patient and the family.  We discussed a brief life review of the patient.  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  They have no children, I asked Theresa Woodard about support, he states that it is just him.  As far as functional and nutritional status Mr. Boice feels that his work wife's weight at 107 is stable.  He shares that she has had a gradual decline since her stroke in 2012.  This is mental and physical decline.  He shares that recently she has been wheelchair-bound, she is also incontinent of bowel  and bladder for approximately 4 and 2 years respectively.   We discussed their current illness and what it means in the larger context of their on-going co-morbidities.  Natural disease trajectory and expectations at EOL were discussed.  We discussed Theresa Woodard's chronic health problems in detail.  I share a diagram of the chronic illness pathway, what is normal and expected.  I attempted to elicit values and goals of care important to the patient.  We talked about advanced directives, ask Theresa Woodard what his wife would want.  He states, "I know how she feels, less than 2 weeks ago she asked me 'bring me a gun'".  The difference between aggressive medical intervention and comfort care was considered in light of the patient's goals of care.  We talked about how we could care for Theresa Woodard, doing things for her, not to her.  Advanced directives, concepts specific to code status, artifical feeding and hydration, and rehospitalization were considered and discussed.  We talked about poor by mouth intake and tube feeding for nutrition and medication.  Theresa Woodard states that his wife and  he would not want a tube to feed her.  Hospice and Palliative Care services outpatient were explained and offered.  Theresa Woodard is considering returning to [place] with hospice versus residential hospice.   Questions and concerns were addressed.  The family was encouraged to call with questions or concerns.  Conversation with nursing staff related to patient care. Conversation with hospitalist related to plan of care/disposition.  Family meeting scheduled for 5/28 at 10:30 AM.  Healthcare power of attorney NEXT OF KIN -husband Theresa Woodard, married 68 years.  No children.   SUMMARY OF RECOMMENDATIONS   Family meeting scheduled for 5/28 at 10:30 AM.   We are discussing the benefits of hospice at [facility] versus residential hospice and went worse.  Code Status/Advance Care Planning:  DNR  Symptom Management:   Per hospitalist,  no additional needs at this time.  Palliative Prophylaxis:   Aspiration, Frequent Pain Assessment and Turn Reposition  Additional Recommendations (Limitations, Scope, Preferences):  At this point continue to treat the treatable with no CPR, no intubation, no PEG tube  Psycho-social/Spiritual:   Desire for further Chaplaincy support:no  Additional Recommendations: Caregiving  Support/Resources and Education on Hospice  Prognosis:   < 4 weeks, or less would not be surprising based on functional status, acute PE, multiple falls in the last 6 days, frailty.  Discharge Planning: To be determined considering hospice at [facility] versus residential hospice.      Primary Diagnoses: Present on Admission: . Pulmonary embolism, bilateral (HCC) . Essential hypertension, benign . Dementia with behavioral disturbance . History of pulmonary embolism . Cardiac pacemaker in situ   I have reviewed the medical record, interviewed the patient and family, and examined the patient. The following aspects are pertinent.  Past Medical History:  Diagnosis Date  . Chronic diarrhea   . Dementia   . Fracture of one rib, unsp side, init for clos fx   . History of vertebral fracture    lumbar region, after fall  . Hypertension   . Osteoporosis   . Pacemaker   . Pulmonary embolus (HCC)   . Sternum fx   . Stroke [facility])    Social History   Socioeconomic History  . Marital status: Married    Spouse name: Not on file  . Number of children: Not on file  . Years of education: Not on file  . Highest education level: Not on file  Occupational History  . Not on file  Social Needs  . Financial resource strain: Not on file  . Food insecurity:    Worry: Not on file    Inability: Not on file  . Transportation needs:    Medical: Not on file    Non-medical: Not on file  Tobacco Use  . Smoking status: Never Smoker  . Smokeless tobacco: Never Used  Substance and Sexual Activity  . Alcohol use:  No  . Drug use: No  . Sexual activity: Not on file  Lifestyle  . Physical activity:    Days per week: Not on file    Minutes per session: Not on file  . Stress: Not on file  Relationships  . Social connections:    Talks on phone: Not on file    Gets together: Not on file    Attends religious service: Not on file    Active member of club or organization: Not on file    Attends meetings of clubs or organizations: Not on file    Relationship status: Not on file  Other Topics Concern  . Not on file  Social History Narrative  . Not on file   History reviewed. No pertinent family history. Scheduled Meds: . divalproex  125 mg Oral Daily  . feeding supplement (ENSURE ENLIVE)  237 mL Oral Daily  . lisinopril  20 mg Oral Daily  . metoprolol succinate  50 mg Oral Daily  . multivitamin with minerals  1 tablet Oral Daily  . warfarin  4 mg Oral Once  . Warfarin - Pharmacist Dosing Inpatient   Does not apply Daily   Continuous Infusions: . sodium chloride 100 mL/hr at 06/14/17 0720  . heparin 650 Units/hr (06/13/17 0149)   PRN Meds:.acetaminophen **OR** acetaminophen, hydrALAZINE, loperamide, LORazepam, LUBRIDERM DAILY MOISTURE, mineral oil-hydrophilic petrolatum, ondansetron **OR** ondansetron (ZOFRAN) IV Medications Prior to Admission:  Prior to Admission medications   Medication Sig Start Date End Date Taking? Authorizing Provider  acetaminophen (TYLENOL) 500 MG tablet Take 500 mg by mouth every 6 (six) hours as needed for moderate pain.    Yes [provider]  calcium carbonate (TUMS - DOSED IN MG ELEMENTAL CALCIUM) 500 MG chewable tablet Chew 1 tablet by mouth 2 (two) times daily.   Yes [provider]  cholecalciferol (VITAMIN D) 1000 units tablet Take 1,000 Units by mouth daily.   Yes [provider]  cholecalciferol (VITAMIN D) 400 units TABS tablet Take 400 Units by mouth 2 (two) times daily.   Yes [provider]  divalproex (DEPAKOTE) 125 MG  DR tablet Take 125 mg by mouth daily.   Yes [provider]  ENSURE (ENSURE) Take 237 mLs by mouth daily.   Yes [provider]  lisinopril (PRINIVIL,ZESTRIL) 20 MG tablet Take 20 mg by mouth daily.   Yes [provider]  loperamide (IMODIUM) 2 MG capsule Take 2 mg by mouth 2 (two) times daily as needed for diarrhea or loose stools.   Yes [provider]  Melatonin 5 MG TABS Take 1 tablet by mouth at bedtime.   Yes [provider]  metoprolol succinate (TOPROL-XL) 25 MG 24 hr tablet Take 25 mg by mouth daily.   Yes [provider]  mineral oil-hydrophilic petrolatum (AQUAPHOR) ointment Apply topically as needed for dry skin. Apply topically to feet and legs twice daily x 14 days for dry skin. 01/10/16  Yes Ngetich, Dinah C, NP  Multiple Vitamin (MULTIVITAMIN WITH MINERALS) TABS tablet Take 1 tablet by mouth daily.   Yes [provider]  Psyllium (METAMUCIL PO) Take 2 capsules by mouth daily.    Yes [provider]  rivaroxaban (XARELTO) 20 MG TABS tablet Take 20 mg by mouth daily.   Yes [provider]   Allergies  Allergen Reactions  . Ativan [Lorazepam]   . Haldol [Haloperidol Lactate]    Review of Systems  Unable to perform ROS: Dementia    Physical Exam  Constitutional: She appears distressed.  HENT:  Head: Atraumatic.  Some temporal wasting  Cardiovascular: Normal rate.  Pulmonary/Chest: Effort normal. No respiratory distress.  Abdominal: Soft. She exhibits no distension.  Musculoskeletal: She exhibits no edema.  Neurological:  Known dementia  Skin: Skin is warm and dry.  Multiple areas of bruising on arms and legs.  Psychiatric:  Somewhat uncooperative  Nursing note and vitals reviewed.   Vital Signs: BP (!) 189/124   Pulse 75   Temp (!) 97.5 F (36.4 C) (Oral)   Resp 18   Ht  (1.549 m)   Wt 48.8 kg (107  lb 9.4 oz)   SpO2 96%   BMI 20.33 kg/m  Pain Scale: PAINAD   Pain Score:  Asleep   SpO2: SpO2: 96 % O2 Device:SpO2: 96 % O2 Flow Rate: .   IO: Intake/output summary:   Intake/Output Summary (Last 24 hours) at 06/14/2017 1343 Last data filed at 06/14/2017 1037 Gross per 24 hour  Intake 2809.5 ml  Output 1350 ml  Net 1459.5 ml    LBM: Last BM Date: 06/13/17 Baseline Weight: Weight: 47.2 kg (104 lb) Most recent weight: Weight: 48.8 kg (107 lb 9.4 oz)     Palliative Assessment/Data:   Flowsheet Rows     Most Recent Value  Intake Tab  Referral Department  Hospitalist  Unit at Time of Referral  Cardiac/Telemetry Unit  Palliative Care Primary Diagnosis  Other (Comment) [BL PE]  Date Notified  06/13/17  Palliative Care Type  New Palliative care  Reason for referral  Clarify Goals of Care  Date of Admission  06-27-2017  Date first seen by Palliative Care  06/14/17  # of days Palliative referral response time  1 Day(s)  # of days IP prior to Palliative referral  2  Clinical Assessment  Palliative Performance Scale Score  20%  Pain Max last 24 hours  Not able to report  Pain Min Last 24 hours  Not able to report  Dyspnea Max Last 24 Hours  Not able to report  Dyspnea Min Last 24 hours  Not able to report  Psychosocial & Spiritual Assessment  Palliative Care Outcomes  Patient/Family meeting held?  Yes  Who was at the meeting?  Husband  Palliative Care Outcomes  Clarified goals of care, Provided end of life care assistance, Counseled regarding hospice, Provided advance care planning  Patient/Family wishes: Interventions discontinued/not started   Mechanical Ventilation, PEG, Tube feedings/TPN      Time In: 0910 Time Out: 1020 Time Total: 70 minutes Greater than 50%  of this time was spent counseling and coordinating care related to the above assessment and plan.  Signed by: Katheran Awe, NP   Please contact Palliative Medicine Team phone at [phone] for questions and concerns.  For individual provider: See Loretha Stapler

## 2017-06-14 NOTE — Care Management Important Message (Signed)
Important Message  Patient Details  Name: Theresa Woodard MRN: [id number] Date of Birth: Aug 03, 1926   Medicare Important Message Given:  Yes    Renie Ora 06/14/2017, 11:12 AM

## 2017-06-15 DIAGNOSIS — J9601 Acute respiratory failure with hypoxia: Secondary | ICD-10-CM | POA: Diagnosis present

## 2017-06-15 DIAGNOSIS — F0151 Vascular dementia with behavioral disturbance: Secondary | ICD-10-CM

## 2017-06-15 DIAGNOSIS — I959 Hypotension, unspecified: Secondary | ICD-10-CM | POA: Diagnosis not present

## 2017-06-15 LAB — CBC
HCT: 36.1 % (ref 36.0–46.0)
Hemoglobin: 11.4 g/dL — ABNORMAL LOW (ref 12.0–15.0)
MCH: 29.5 pg (ref 26.0–34.0)
MCHC: 31.6 g/dL (ref 30.0–36.0)
MCV: 93.5 fL (ref 78.0–100.0)
PLATELETS: 198 10*3/uL (ref 150–400)
RBC: 3.86 MIL/uL — ABNORMAL LOW (ref 3.87–5.11)
RDW: 17.8 % — ABNORMAL HIGH (ref 11.5–15.5)
WBC: 7.1 10*3/uL (ref 4.0–10.5)

## 2017-06-15 LAB — PROTIME-INR
INR: 1.65
PROTHROMBIN TIME: 19.3 s — AB (ref 11.4–15.2)

## 2017-06-15 LAB — HEPARIN LEVEL (UNFRACTIONATED): HEPARIN UNFRACTIONATED: 0.27 [IU]/mL — AB (ref 0.30–0.70)

## 2017-06-15 MED ORDER — WARFARIN SODIUM 1 MG PO TABS: 1.0000 mg | ORAL_TABLET | Freq: Once | ORAL | Status: DC

## 2017-06-15 MED ORDER — MORPHINE SULFATE (CONCENTRATE) 10 MG/0.5ML PO SOLN: 2.6000 mg | ORAL | Status: DC | PRN

## 2017-06-15 MED ORDER — MORPHINE SULFATE (CONCENTRATE) 10 MG/0.5ML PO SOLN
2.6000 mg | ORAL | Status: DC
  Administered 2017-06-15: 2.6 mg via ORAL
  Filled 2017-06-15: qty 0.5

## 2017-06-15 MED ORDER — ATROPINE SULFATE 1 % OP SOLN: 2.0000 [drp] | Freq: Three times a day (TID) | OPHTHALMIC | Status: DC | PRN

## 2017-06-15 MED ORDER — POLYVINYL ALCOHOL 1.4 % OP SOLN: 2.0000 [drp] | OPHTHALMIC | Status: DC | PRN

## 2017-06-19 NOTE — Discharge Summary (Signed)
Physician Discharge Summary  Theresa Woodard RUE:[id number] DOB: 1927-01-06 DOA: 06/13/2017  PCP: Ignatius Specking, MD  Admit date: 06/05/2017 Discharge date: July 13, 2017  Admitted From: Assisted living  Patient expired on 07/13/2017 at 1: 08 PM      Discharge Diagnoses:  Principal Problem:   Pulmonary embolism, bilateral (HCC)   Active Problems:   Dementia with behavioral disturbance   Cardiac pacemaker in situ   CVA, old, cognitive deficits   Essential hypertension, benign   History of pulmonary embolism   Palliative care by specialist   Goals of care, counseling/discussion   Encounter for hospice care discussion   Hypotension   Acute respiratory failure with hypoxia [facility])   Brief narrative/HPI 84 year old female with history of stroke, hypertension, advanced dementia, history of PE about a year ago on Xarelto (does not appear to have missed a dose) presented with 4 episodes of fall in the past 6 days and complained of generalized weakness and back pain.  She has gotten weak and difficult to ambulate. In the ED she was hypertensive with stable remaining vitals.  A CT of the thoracic and lumbar spine showed chronic thoracic fractures with no acute findings.  CT of the abdomen and pelvis was unremarkable.  She then underwent CT angiogram of the chest with contrast which showed bilateral acute PE (L >R) with CT evidence of right heart strain.  Also found to have 4.3 cm ascending aortic aneurysm. Head CT was negative for acute findings. Patient started on IV heparin and admitted to stepdown unit.  Hospital course  Principal Problem:   Pulmonary embolism, bilateral [facility]) Admitting hospitalist discussed with IR who recommended this is likely acute on chronic PE.  Given her advanced age and dementia with stable hemodynamic status is not a candidate for thrombolysis.  EF of 65-70% on echo with moderate LVH, grade 1 diastolic dysfunction and elevated LV filling pressure with no significant  right heart strain. No DVT seen on right lower extremity.  (Left lower extremity could not be performed) Patient placed on heparin and started on Coumadin after discussion with hematologist.  However during the hospital course patient was increasingly confused, with poor p.o. intake and refusing medications. Given her overall guarded prognosis with her advanced age, severe dementia, frequent falls and her bilateral PE goals of care discussion was done with palliative care.  Husband wished patient to be discharged to Overall prognosis is extremely guarded with advanced age, severe dementia and frequent falls which puts her at high risk for bleeding. Palliative care consulted for goals of care discussion.  After discussion husband was interested in her being discharged to ALF with hospice versus residential hospice.  However on 5/28 patient started becoming hypotensive, poorly arousable with shallow breathing.  She was made comfort care inpatient after discussing with husband at bedside.  Patient expired on 5/28 at 1:08 PM  Active Problems:  Acute respiratory failure with hypoxia Secondary to pulmonary embolism.  Elevated troponin Mild. associated with clot burden.    Uncontrolled hypertension Blood pressure was elevated frequently during the hospital stay requiring PRN hydralazine in addition to her home medication.  Became hypotensive since this morning.  History of CVA      Dementia with behavioral disturbance, Required low-dose Ativan for agitation.    Cardiac pacemaker in situ  Recurrent fall.     Family Communication  :  Discussed with husband at bedside this morning.  Notified him on the phone of patient's death.    Consults  : Discussed  with hematologist on the phone.  Palliative care  Procedures  : CT head, CT angiogram chest, CT abdomen, CT thoracic and lumbar spine.  2D echo and Doppler lower extremity   Discharge Instructions   Allergies as of  06/03/2017      Reactions   Ativan [lorazepam]    Haldol [haloperidol Lactate]        Allergies  Allergen Reactions  . Ativan [Lorazepam]   . Haldol [Haloperidol Lactate]         Procedures/Studies: Dg Chest 2 View  Result Date: 06/06/2017 CLINICAL DATA:  Status post unwitnessed fall, with upper back pain. Hypertension and atrial fibrillation. EXAM: CHEST - 2 VIEW COMPARISON:  Chest radiograph and CTA of the chest performed 04/17/2016 FINDINGS: The lungs are hypoexpanded. Mild left basilar airspace opacity may reflect atelectasis or mild pneumonia. There is no evidence of pleural effusion or pneumothorax. The heart is borderline enlarged. A pacemaker is noted at the left chest wall, with leads ending at the right atrium and right ventricle. No acute osseous abnormalities are seen. There is chronic loss of height at the lower thoracic spine. IMPRESSION: 1. Lungs hypoexpanded. Mild left basilar airspace opacity may reflect atelectasis or mild pneumonia. Borderline cardiomegaly. 2. Chronic loss of height of the lower thoracic spine. Electronically Signed   By: Roanna Raider M.D.   On: 06/06/2017 00:11   Dg Pelvis 1-2 Views  Result Date: 06/06/2017 CLINICAL DATA:  Status post unwitnessed fall, with lower back pain. Concern for pelvic injury. Initial encounter. EXAM: PELVIS - 1-2 VIEW COMPARISON:  None. FINDINGS: There is no evidence of fracture or dislocation. Both femoral heads are seated normally within their respective acetabula. No significant degenerative change is appreciated. Sclerosis is noted at the sacroiliac joints bilaterally. The visualized bowel gas pattern is grossly unremarkable in appearance. Scattered postoperative change is noted about the pelvis. Vascular calcifications are seen. IMPRESSION: No evidence of fracture or dislocation. Electronically Signed   By: Roanna Raider M.D.   On: 06/06/2017 00:11   Ct Head Wo Contrast  Result Date: 06-24-2017 CLINICAL DATA:  Ataxia,  multiple falls. EXAM: CT HEAD WITHOUT CONTRAST TECHNIQUE: Contiguous axial images were obtained from the base of the skull through the vertex without intravenous contrast. COMPARISON:  CT scan of Jun 05, 2017. FINDINGS: Brain: Stable diffuse cortical atrophy is noted. Mild chronic ischemic white matter disease is noted. No mass effect or midline shift is noted. Ventricular size is within normal limits. There is no evidence of mass lesion, hemorrhage or acute infarction. Vascular: No hyperdense vessel or unexpected calcification. Skull: Normal. Negative for fracture or focal lesion. Sinuses/Orbits: No acute finding. Other: None. IMPRESSION: Stable diffuse cortical atrophy. Mild chronic ischemic white matter disease. No acute intracranial abnormality seen. Electronically Signed   By: Lupita Raider, M.D.   On: 2017-06-24 16:41   Ct Head Wo Contrast  Result Date: 06/05/2017 CLINICAL DATA:  Status post unwitnessed fall. Concern for head or cervical spine injury. Hallucinations. EXAM: CT HEAD WITHOUT CONTRAST CT CERVICAL SPINE WITHOUT CONTRAST TECHNIQUE: Multidetector CT imaging of the head and cervical spine was performed following the standard protocol without intravenous contrast. Multiplanar CT image reconstructions of the cervical spine were also generated. COMPARISON:  CT of the head and cervical spine performed 04/16/2016 FINDINGS: CT HEAD FINDINGS Brain: No evidence of acute infarction, hemorrhage, hydrocephalus, extra-axial collection or mass lesion / mass effect. Prominence of the ventricles and sulci reflects moderately severe cortical volume loss. Cerebellar atrophy is noted. Scattered periventricular  and subcortical white matter change likely reflects small vessel ischemic microangiopathy. A chronic lacunar infarct is noted at the left basal ganglia. The brainstem and fourth ventricle are within normal limits. The basal ganglia are unremarkable in appearance. The cerebral hemispheres demonstrate grossly  normal gray-white differentiation. No mass effect or midline shift is seen. Vascular: No hyperdense vessel or unexpected calcification. Skull: There is no evidence of fracture; visualized osseous structures are unremarkable in appearance. Sinuses/Orbits: The visualized portions of the orbits are within normal limits. The paranasal sinuses and mastoid air cells are well-aerated. Other: No significant soft tissue abnormalities are seen. CT CERVICAL SPINE FINDINGS Alignment: There is grade 1 anterolisthesis of C3 on C4 and of C4 on C5, with underlying facet disease. Skull base and vertebrae: No acute fracture. No primary bone lesion or focal pathologic process. Soft tissues and spinal canal: No prevertebral fluid or swelling. No visible canal hematoma. Disc levels: Multilevel disc space narrowing is noted along the cervical spine, with scattered anterior and posterior disc osteophyte complexes. Upper chest: The visualized lung apices are clear. The thyroid gland is unremarkable. Dense calcification is seen at the carotid bifurcations bilaterally, with likely mild luminal narrowing bilaterally. Other: No additional soft tissue abnormalities are seen. IMPRESSION: 1. No evidence of traumatic intracranial injury or fracture. 2. No evidence of acute fracture or subluxation along the cervical spine. 3. Moderately severe cortical volume loss and scattered small vessel ischemic microangiopathy. 4. Chronic lacunar infarct at the left basal ganglia. 5. Degenerative change noted along the cervical spine. 6. Dense calcification at the carotid bifurcations bilaterally, with likely mild luminal narrowing bilaterally. Carotid ultrasound would be helpful for further evaluation, when and as deemed clinically appropriate. Electronically Signed   By: Roanna Raider M.D.   On: 06/05/2017 23:51   Ct Angio Chest Pe W And/or Wo Contrast  Result Date: Jul 02, 2017 CLINICAL DATA:  fall last night. Staff reported pt complained of back pain  but denies pain now. Husband reports 4 falls in 6 days and has been weak EXAM: CT ANGIOGRAPHY CHEST WITH CONTRAST TECHNIQUE: Multidetector CT imaging of the chest was performed using the standard protocol during bolus administration of intravenous contrast. Multiplanar CT image reconstructions and MIPs were obtained to evaluate the vascular anatomy. CONTRAST:  80mL ISOVUE-370 IOPAMIDOL (ISOVUE-370) INJECTION 76% COMPARISON:  04/17/2016 FINDINGS: Cardiovascular: Moderate cardiomegaly. No pericardial effusion. Right ventricular dilatation. RV/LV ratio 1.5. Dilated central pulmonary arteries, present since 01/31/2015. There is a partially large central filling defect at the bifurcation of the left pulmonary artery. Partially occlusive embolus in left lower lobe branch. Possible nonocclusive thrombus in a posterior right lower lobe branch although motion degrades images through the lung bases. Dilatation of the ascending aorta up to 4.3 cm diameter. There is incomplete opacification of the arch and descending thoracic aorta, limiting assessment for aortic dissection. Scattered coarse calcified plaque. Mediastinum/Nodes: No hilar or mediastinal adenopathy. Lungs/Pleura: Trace right pleural effusion. No pneumothorax. Minimal dependent atelectasis posteriorly in both lung bases. Lungs otherwise clear. Upper Abdomen: No acute findings Musculoskeletal: Interval T12 vertebral body cement augmentation. Stable compression deformities of T6, T7 and T11. Old healed sternal fracture. No acute fracture. Review of the MIP images confirms the above findings. IMPRESSION: 1. Positive for bilateral acute pulmonary emboli, left greater than right. CT evidence of right heart strain (RV/LV Ratio = 1.5) suggesting at least submassive (intermediate risk) PE. The presence of right heart strain has been associated with an increased risk of morbidity and mortality. Please activate Code PE by  paging [phone]. Critical Value/emergent results  were called by telephone at the time of interpretation on 06/16/2017 at 4:51 pm to Dr. Marily Memos , who verbally acknowledged these results. 2. 4.3 cm ascending aortic aneurysm. Recommend annual imaging followup by CTA or MRA. This recommendation follows 2010 ACCF/AHA/AATS/ACR/ASA/SCA/SCAI/SIR/STS/SVM Guidelines for the Diagnosis and Management of Patients with Thoracic Aortic Disease. Circulation. 2010; 121: U981-X914 Electronically Signed   By: Corlis Leak M.D.   On: Jun 16, 2017 16:52   Ct Cervical Spine Wo Contrast  Result Date: 06/05/2017 CLINICAL DATA:  Status post unwitnessed fall. Concern for head or cervical spine injury. Hallucinations. EXAM: CT HEAD WITHOUT CONTRAST CT CERVICAL SPINE WITHOUT CONTRAST TECHNIQUE: Multidetector CT imaging of the head and cervical spine was performed following the standard protocol without intravenous contrast. Multiplanar CT image reconstructions of the cervical spine were also generated. COMPARISON:  CT of the head and cervical spine performed 04/16/2016 FINDINGS: CT HEAD FINDINGS Brain: No evidence of acute infarction, hemorrhage, hydrocephalus, extra-axial collection or mass lesion / mass effect. Prominence of the ventricles and sulci reflects moderately severe cortical volume loss. Cerebellar atrophy is noted. Scattered periventricular and subcortical white matter change likely reflects small vessel ischemic microangiopathy. A chronic lacunar infarct is noted at the left basal ganglia. The brainstem and fourth ventricle are within normal limits. The basal ganglia are unremarkable in appearance. The cerebral hemispheres demonstrate grossly normal gray-white differentiation. No mass effect or midline shift is seen. Vascular: No hyperdense vessel or unexpected calcification. Skull: There is no evidence of fracture; visualized osseous structures are unremarkable in appearance. Sinuses/Orbits: The visualized portions of the orbits are within normal limits. The paranasal  sinuses and mastoid air cells are well-aerated. Other: No significant soft tissue abnormalities are seen. CT CERVICAL SPINE FINDINGS Alignment: There is grade 1 anterolisthesis of C3 on C4 and of C4 on C5, with underlying facet disease. Skull base and vertebrae: No acute fracture. No primary bone lesion or focal pathologic process. Soft tissues and spinal canal: No prevertebral fluid or swelling. No visible canal hematoma. Disc levels: Multilevel disc space narrowing is noted along the cervical spine, with scattered anterior and posterior disc osteophyte complexes. Upper chest: The visualized lung apices are clear. The thyroid gland is unremarkable. Dense calcification is seen at the carotid bifurcations bilaterally, with likely mild luminal narrowing bilaterally. Other: No additional soft tissue abnormalities are seen. IMPRESSION: 1. No evidence of traumatic intracranial injury or fracture. 2. No evidence of acute fracture or subluxation along the cervical spine. 3. Moderately severe cortical volume loss and scattered small vessel ischemic microangiopathy. 4. Chronic lacunar infarct at the left basal ganglia. 5. Degenerative change noted along the cervical spine. 6. Dense calcification at the carotid bifurcations bilaterally, with likely mild luminal narrowing bilaterally. Carotid ultrasound would be helpful for further evaluation, when and as deemed clinically appropriate. Electronically Signed   By: Roanna Raider M.D.   On: 06/05/2017 23:51   Ct Abdomen Pelvis W Contrast  Result Date: 06/16/17 CLINICAL DATA:  Per ED notes: Pt brought in by EMS from [place] due to fall last night. Staff reported pt complained of back pain but denies pain now. Husband reports 4 falls in 6 days and has been weak EXAM: CT ABDOMEN AND PELVIS WITH CONTRAST TECHNIQUE: Multidetector CT imaging of the abdomen and pelvis was performed using the standard protocol following bolus administration of intravenous contrast. CONTRAST:   80mL ISOVUE-370 IOPAMIDOL (ISOVUE-370) INJECTION 76% COMPARISON:  Chest CTA, 04/17/2016 FINDINGS: Lower chest: See current chest CT. Hepatobiliary:  No focal liver abnormality is seen. No gallstones, gallbladder wall thickening, or biliary dilatation. Pancreas: Unremarkable. No pancreatic ductal dilatation or surrounding inflammatory changes. Spleen: Normal size spleen. Heterogeneous early enhancement that becomes more homogeneous on the delayed image. No convincing splenic mass. Adrenals/Urinary Tract: No adrenal masses. Bilateral renal cortical thinning. Low-density mass arises from the cortex of the midpole the left kidney consistent with a cyst. No stones. No hydronephrosis. Ureters are normal in course and in caliber. Bladder is unremarkable. Stomach/Bowel: Stomach is unremarkable. No evidence of bowel obstruction. No bowel wall thickening or inflammation. Anastomosis staple line is noted along the cecum consistent with a prior appendectomy. Vascular/Lymphatic: Extensive atherosclerosis noted throughout the abdominal aorta and its branch vessels. No pathologically enlarged lymph nodes. Reproductive: Status post hysterectomy. No adnexal masses. Other: Small fat containing infraumbilical midline hernia. No ascites. Musculoskeletal: Mild compression fracture of T11. Moderate compression fracture of T12. These are stable when compared to the prior chest CT. Sclerosis along the sacral ala is also consistent with old fractures. No new fractures. Bones are diffusely demineralized. No osteoblastic or osteolytic lesions. IMPRESSION: 1. No acute findings within the abdomen or pelvis. 2. Old fractures of T11, T12 and the sacral ala. Electronically Signed   By: Amie [place] M.D.   On: 06/16/2017 16:48   US Venous Img Lower Bilateral  Result Date: 06/14/2017 CLINICAL DATA:  Pulmonary embolism. EXAM: BILATERAL LOWER EXTREMITY VENOUS DUPLEX ULTRASOUND TECHNIQUE: Doppler venous assessment of the bilateral lower extremity  deep venous system was attempted, including characterization of spectral flow, compressibility, and phasicity. The patient was combative and only a limited examination of the right lower extremity venous system was performed. Examination of the left lower extremity venous system was not performed. The nurse was unable to give additional medications according to the ultrasound technologist. COMPARISON:  None. FINDINGS: There is grossly complete compressibility of the visualized portions of the right common femoral, femoral, and popliteal veins. Doppler analysis demonstrates respiratory phasicity and augmentation of flow with calf compression. Evaluation of the calf was not performed. Evaluation of the deep venous system of the left lower extremity was not performed. IMPRESSION: Limited examination. No obvious DVT in the right lower extremity. Evaluation of the left lower extremity was not performed. Electronically Signed   By: Jolaine Click M.D.   On: 06/14/2017 10:16   Ct T-spine No Charge  Result Date: 16-Jun-2017 CLINICAL DATA:  84 y/o  F; fall.  Evaluation of back pain. EXAM: CT THORACIC AND LUMBAR SPINE WITHOUT CONTRAST TECHNIQUE: Multidetector CT imaging of the thoracic and lumbar spine was performed without contrast. Multiplanar CT image reconstructions were also generated. COMPARISON:  04/17/2016 CT of the chest. Concurrent CT of chest, abdomen, and pelvis. FINDINGS: CT THORACIC SPINE FINDINGS Alignment: Moderate levocurvature of upper thoracic spine. Normal thoracic kyphosis without listhesis. Vertebrae: Stable mild T6 and T7 compression deformities. Stable mild T11 and moderate to severe T12 compression deformity. Paraspinal and other soft tissues: Please refer to the concurrent CT of the chest. Disc levels: Mild multilevel disc and facet degenerative changes. No significant bony foraminal or canal stenosis. CT LUMBAR SPINE FINDINGS Segmentation: Mild lumbar levocurvature with apex at L3. Alignment:  Normal lumbar lordosis without listhesis. Vertebrae: No acute fracture or focal pathologic process. Paraspinal and other soft tissues: Please refer to concurrent CT of the abdomen and pelvis. Disc levels: Mild L1-2 and severe L2-L4 loss of disc space height. Multilevel facet hypertrophy. No high-grade bony canal stenosis. IMPRESSION: CT THORACIC SPINE IMPRESSION 1. Stable chronic  T6, T7, T11, and T12 compression deformities. 2. No acute fracture identified. CT LUMBAR SPINE IMPRESSION No acute fracture identified. Electronically Signed   By: Mitzi Hansen M.D.   On: 06/04/2017 16:55   Ct L-spine No Charge  Result Date: 06/06/2017 CLINICAL DATA:  84 y/o  F; fall.  Evaluation of back pain. EXAM: CT THORACIC AND LUMBAR SPINE WITHOUT CONTRAST TECHNIQUE: Multidetector CT imaging of the thoracic and lumbar spine was performed without contrast. Multiplanar CT image reconstructions were also generated. COMPARISON:  04/17/2016 CT of the chest. Concurrent CT of chest, abdomen, and pelvis. FINDINGS: CT THORACIC SPINE FINDINGS Alignment: Moderate levocurvature of upper thoracic spine. Normal thoracic kyphosis without listhesis. Vertebrae: Stable mild T6 and T7 compression deformities. Stable mild T11 and moderate to severe T12 compression deformity. Paraspinal and other soft tissues: Please refer to the concurrent CT of the chest. Disc levels: Mild multilevel disc and facet degenerative changes. No significant bony foraminal or canal stenosis. CT LUMBAR SPINE FINDINGS Segmentation: Mild lumbar levocurvature with apex at L3. Alignment: Normal lumbar lordosis without listhesis. Vertebrae: No acute fracture or focal pathologic process. Paraspinal and other soft tissues: Please refer to concurrent CT of the abdomen and pelvis. Disc levels: Mild L1-2 and severe L2-L4 loss of disc space height. Multilevel facet hypertrophy. No high-grade bony canal stenosis. IMPRESSION: CT THORACIC SPINE IMPRESSION 1. Stable chronic T6,  T7, T11, and T12 compression deformities. 2. No acute fracture identified. CT LUMBAR SPINE IMPRESSION No acute fracture identified. Electronically Signed   By: Mitzi Hansen M.D.   On: 05/23/2017 16:55    Discharge Exam: Vitals:   July 04, 2017 0429 04-Jul-2017 1003  BP: 125/68 (!) 70/58  Pulse: 72 63  Resp: 18 (!) 38  Temp: 97.7 F (36.5 C) 97.7 F (36.5 C)  SpO2: 95% 100%   Vitals:   06/14/17 1400 06/14/17 2133 07-04-17 0429 July 04, 2017 1003  BP: (!) 189/94 (!) 158/97 125/68 (!) 70/58  Pulse: 75 (!) 59 72 63  Resp: 18 18 18  (!) 38  Temp:  98.3 F (36.8 C) 97.7 F (36.5 C) 97.7 F (36.5 C)  TempSrc:  Oral Oral Axillary  SpO2: 97% 95% 95% 100%  Weight:      Height:            The results of significant diagnostics from this hospitalization (including imaging, microbiology, ancillary and laboratory) are listed below for reference.     Microbiology: Recent Results (from the past 240 hour(s))  MRSA PCR Screening     Status: None   Collection Time: 05/27/2017  6:30 PM  Result Value Ref Range Status   MRSA by PCR NEGATIVE NEGATIVE Final    Comment:        The GeneXpert MRSA Assay (FDA approved for NASAL specimens only), is one component of a comprehensive MRSA colonization surveillance program. It is not intended to diagnose MRSA infection nor to guide or monitor treatment for MRSA infections. Performed at [facility], [address], [place], [place] [address]      Labs: BNP (last 3 results) No results for input(s): BNP in the last 8760 hours. Basic Metabolic Panel: Recent Labs  Lab 05/27/2017 1329 06/12/17 0127  NA 135 138  K 4.5 4.1  CL 102 105  CO2 26 26  GLUCOSE 95 90  BUN 19 15  CREATININE 0.93 0.83  CALCIUM 8.5* 8.3*   Liver Function Tests: Recent Labs  Lab 06/13/2017 1329  AST 22  ALT 12*  ALKPHOS 77  BILITOT 0.5  PROT 6.1*  ALBUMIN 3.0*   No results for input(s): LIPASE, AMYLASE in the last 168 hours. No results for input(s): AMMONIA  in the last 168 hours. CBC: Recent Labs  Lab 07/02/2017 1329 06/12/17 0127 06/13/17 0430 06/14/17 0625 05/26/2017 0441  WBC 7.3 7.1 6.0 7.4 7.1  NEUTROABS 5.3  --   --   --   --   HGB 12.1 11.4* 11.7* 12.4 11.4*  HCT 38.5 35.7* 37.8 40.1 36.1  MCV 94.8 94.2 95.2 94.8 93.5  PLT 189 182 180 199 198   Cardiac Enzymes: Recent Labs  Lab 07/02/2017 1329 07/02/2017 1829 06/12/17 0127 06/12/17 0806  TROPONINI <0.03 0.04* 0.03* 0.03*   BNP: Invalid input(s): POCBNP CBG: Recent Labs  Lab Jul 02, 2017 2215  GLUCAP 98   D-Dimer No results for input(s): DDIMER in the last 72 hours. Hgb A1c No results for input(s): HGBA1C in the last 72 hours. Lipid Profile No results for input(s): CHOL, HDL, LDLCALC, TRIG, CHOLHDL, LDLDIRECT in the last 72 hours. Thyroid function studies No results for input(s): TSH, T4TOTAL, T3FREE, THYROIDAB in the last 72 hours.  Invalid input(s): FREET3 Anemia work up No results for input(s): VITAMINB12, FOLATE, FERRITIN, TIBC, IRON, RETICCTPCT in the last 72 hours. Urinalysis    Component Value Date/Time   COLORURINE YELLOW 02-Jul-2017 1325   APPEARANCEUR CLEAR 07-02-17 1325   LABSPEC 1.013 2017-07-02 1325   PHURINE 5.0 07-02-2017 1325   GLUCOSEU NEGATIVE 02-Jul-2017 1325   HGBUR NEGATIVE 2017-07-02 1325   BILIRUBINUR NEGATIVE 2017-07-02 1325   KETONESUR NEGATIVE 2017-07-02 1325   PROTEINUR NEGATIVE 07-02-2017 1325   NITRITE NEGATIVE 07-02-17 1325   LEUKOCYTESUR NEGATIVE 02-Jul-2017 1325   Sepsis Labs Invalid input(s): PROCALCITONIN,  WBC,  LACTICIDVEN Microbiology Recent Results (from the past 240 hour(s))  MRSA PCR Screening     Status: None   Collection Time: 2017-07-02  6:30 PM  Result Value Ref Range Status   MRSA by PCR NEGATIVE NEGATIVE Final    Comment:        The GeneXpert MRSA Assay (FDA approved for NASAL specimens only), is one component of a comprehensive MRSA colonization surveillance program. It is not intended to diagnose  MRSA infection nor to guide or monitor treatment for MRSA infections. Performed at [facility], [address], [place], [place] [address]      Time coordinating discharge: < 30 minutes  SIGNED:   Eddie North, MD  [facility] 05/19/2017, 1:51 PM Pager   If 7PM-7AM, please contact night-coverage [email or web address] Password [email or web address]

## 2017-06-19 NOTE — Progress Notes (Signed)
Pt passed at approximately 1308. Confirmed by two RN's, Ree Edman and Dagoberto Ligas. Dr. Gonzella Lex paged and made aware. [facility] made aware of referral. Pt is not a donor candidate. Referral number [phone] given by Felipa Eth.

## 2017-06-19 NOTE — Progress Notes (Signed)
Palliative: Chart reviewed, vital signs seem to suggest that Theresa Woodard is actively dying.  Conference with nursing staff who endorse that indeed, Theresa Woodard is actively dying.   I entered the room to find Theresa Woodard at bedside.  Theresa Woodard is having periods of apnea.  It seems that time is short.  Theresa Woodard and I discussed the change that has occurred for his wife.  Theresa Woodard states that he has discussed her condition with the hospitalist, he agrees that she appears to be dying.  He states that she will no longer have to suffer. We talked about comfort measures, making sure that Theresa Woodard has medications for pain and anxiety, unburdening her from any treatments that are painful.  He readily agrees. Theresa Woodard asks about prognosis.  We talked about what is normal and expected as people near end-of-life.  I share that I believe she may have a hours to days.  We talked about the benefits of residential hospice.  I share my concern that she may not be stable for transfer, but at times people can live longer than we expect.  Theresa Woodard is agreeable to transfer to residential hospice, if his wife is stable enough. Theresa Woodard catches me in the hallway, stating that he will be leaving, he is going to the funeral home to make arrangements.  I share with him that we will be here with his wife. Comfort orders initiated. Conference with hospitalist related to comfort measures, plan of care. 35 minutes Theresa Woodard, Theresa Woodard

## 2017-06-19 NOTE — Progress Notes (Signed)
ANTICOAGULATION CONSULT NOTE  Pharmacy Consult for heparin>>warfarin Indication: PE  Allergies  Allergen Reactions  . Ativan [Lorazepam]   . Haldol [Haloperidol Lactate]    Patient Measurements: Height:  (154.9 cm) Weight: 107 lb 9.4 oz (48.8 kg) IBW/kg (Calculated) : 47.8 HEPARIN DW (KG): 47.2   Vital Signs: Temp: 97.7 F (36.5 C) (05/28 0429) Temp Source: Oral (05/28 0429) BP: 125/68 (05/28 0429) Pulse Rate: 72 (05/28 0429)  Labs: Recent Labs    06/12/17 1416  06/13/17 0430 06/14/17 0625 07-03-2017 0441  HGB  --    < > 11.7* 12.4 11.4*  HCT  --   --  37.8 40.1 36.1  PLT  --   --  180 199 198  APTT 85*  --  82* 76*  --   LABPROT  --   --  14.5 14.6 19.3*  INR  --   --  1.14 1.14 1.65  HEPARINUNFRC  --   --  0.61 0.30 0.27*   < > = values in this interval not displayed.   Estimated Creatinine Clearance: 34.7 mL/min (by C-G formula based on SCr of 0.83 mg/dL).  Assessment: Heparin slightly below goal.  INR increased to 1.65.   Okay for Protocol, last Xarelto at 8am 5/24.    No bleeding noted at this time.  CBC stable.  Goal of Therapy:  Goal INR 2-3 Heparin level 0.3-0.7 units/ml Monitor platelets by anticoagulation protocol: Yes   Plan:  Warfarin  x1 dose today Continue daily PT/INR  Increase heparin infusion rate at 700 units/hr Continue to monitor H&H and platelets Daily Heparin level  Mady Gemma, [facility] July 03, 2017 8:21 AM

## 2017-06-19 DEATH — deceased

## 2018-08-18 IMAGING — CT CT L SPINE W/O CM
3 of 4 series · 10 of 33 positions shown, 11 images · IV contrast (Isovue)
Comparison: 04/17/2016 CT of the chest. Concurrent CT of chest,
abdomen, and pelvis.

CLINICAL DATA: 89 y/o  F; fall.  Evaluation of back pain.

EXAM:
CT THORACIC AND LUMBAR SPINE WITHOUT CONTRAST
TECHNIQUE: Multidetector CT imaging of the thoracic and lumbar spine was
performed without contrast. Multiplanar CT image reconstructions
were also generated.

[Series 1: axial st lumbar · axial · 0.31mm/px · z∈[-303,-198]mm · 2 of 54 slices shown, 3 images]
[im 22/54  soft-tissue]
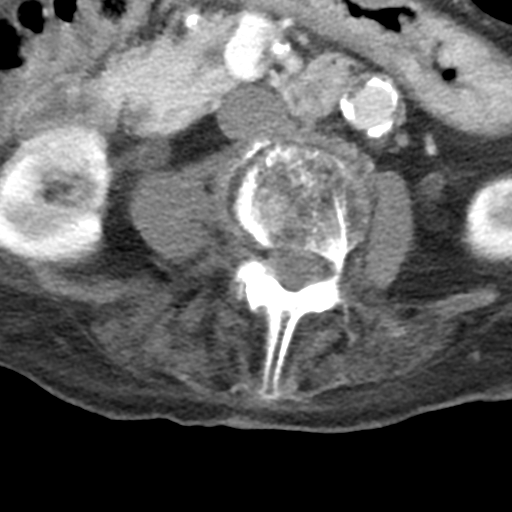
[im 22/54  bone]
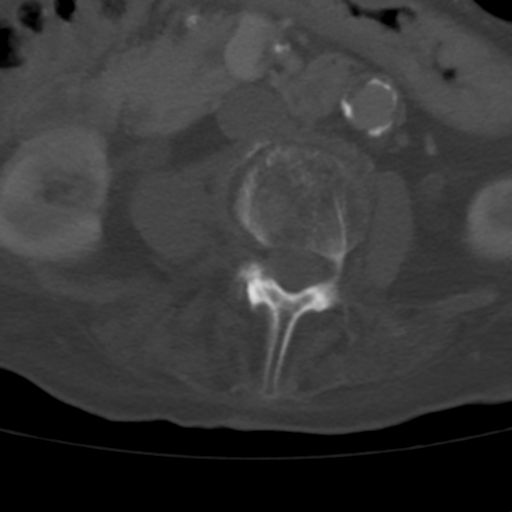
[im 43/54  bone]
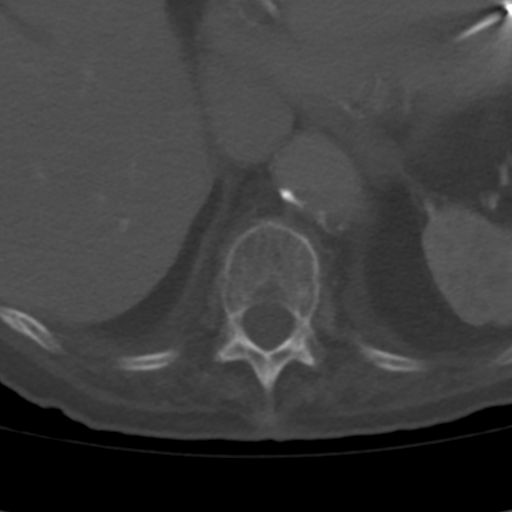

[Series 4: coronal lspine · coronal · 0.35mm/px · 3 of 41 slices shown]
[im 9/41  bone]
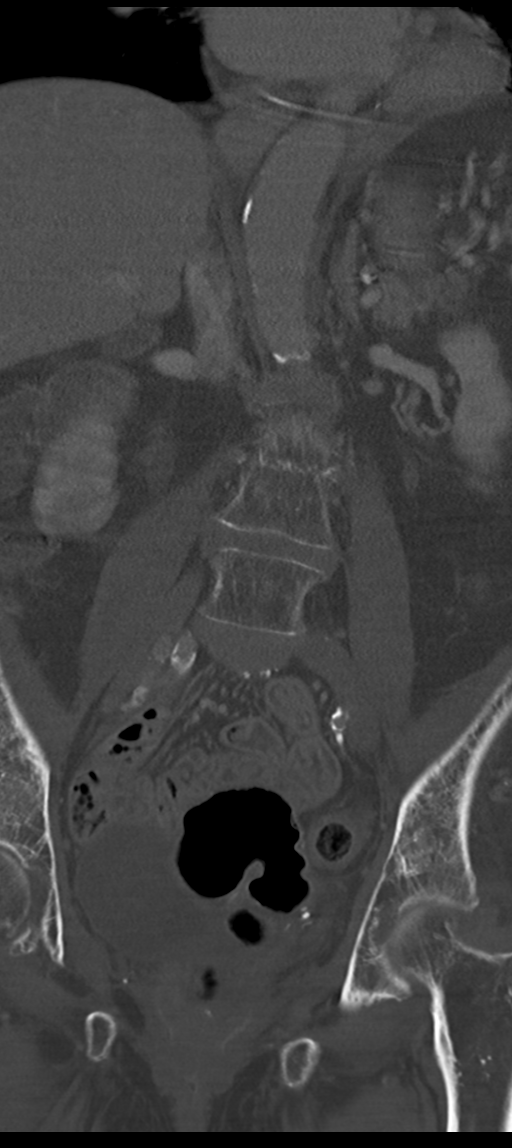
[im 17/41  bone]
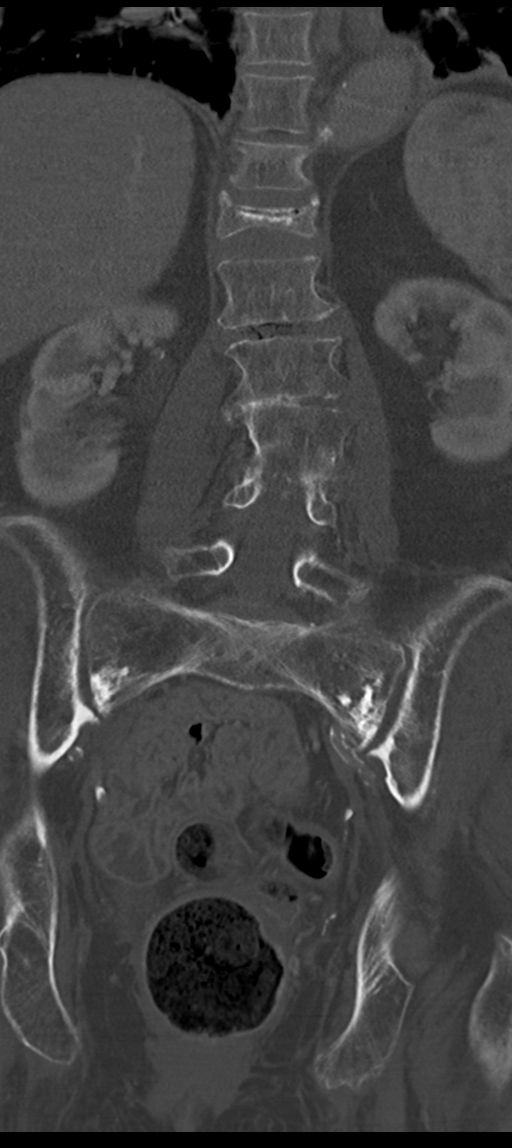
[im 25/41  bone]
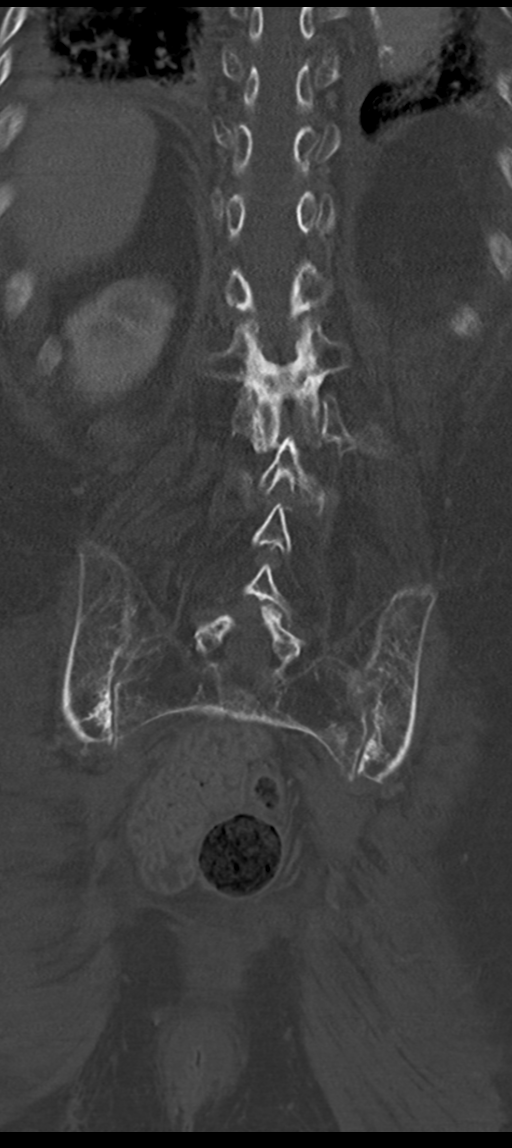

[Series 5: sagittal st · sagittal · 0.27mm/px · 5 of 41 slices shown]
[im 14/41  bone]
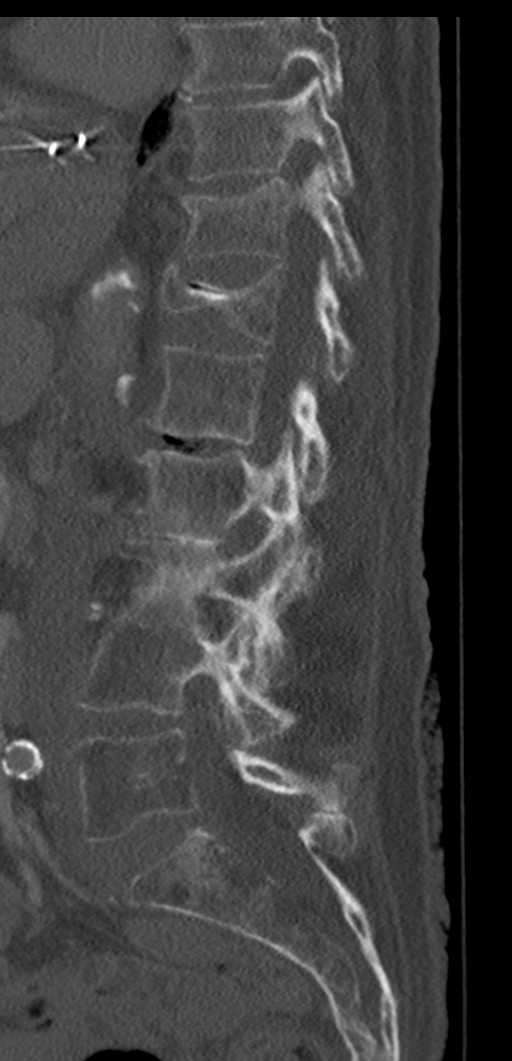
[im 17/41  bone]
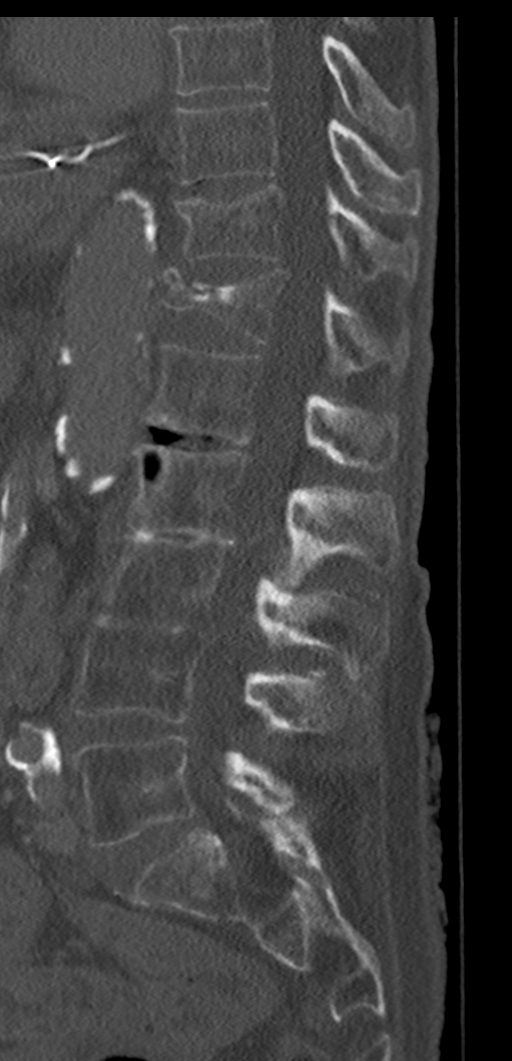
[im 21/41  bone]
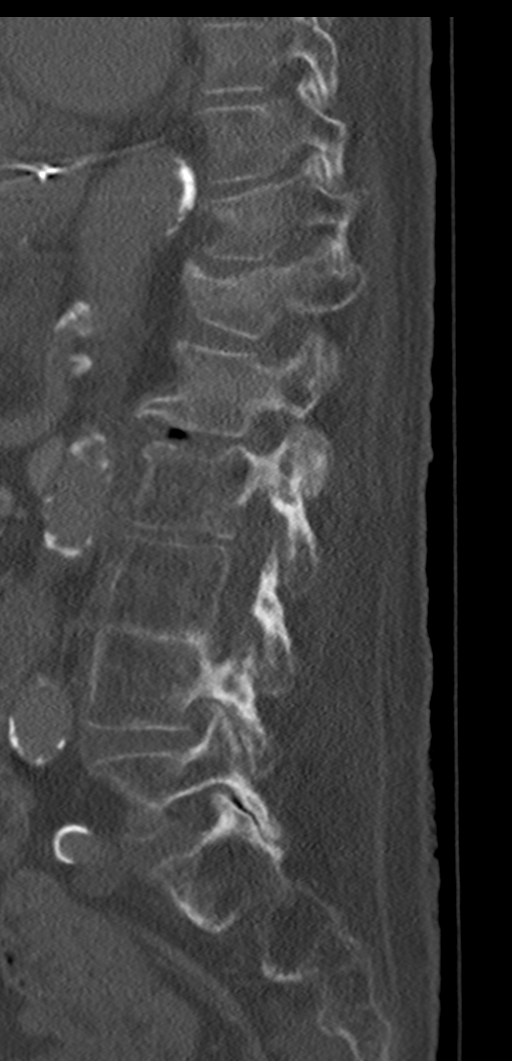
[im 24/41  bone]
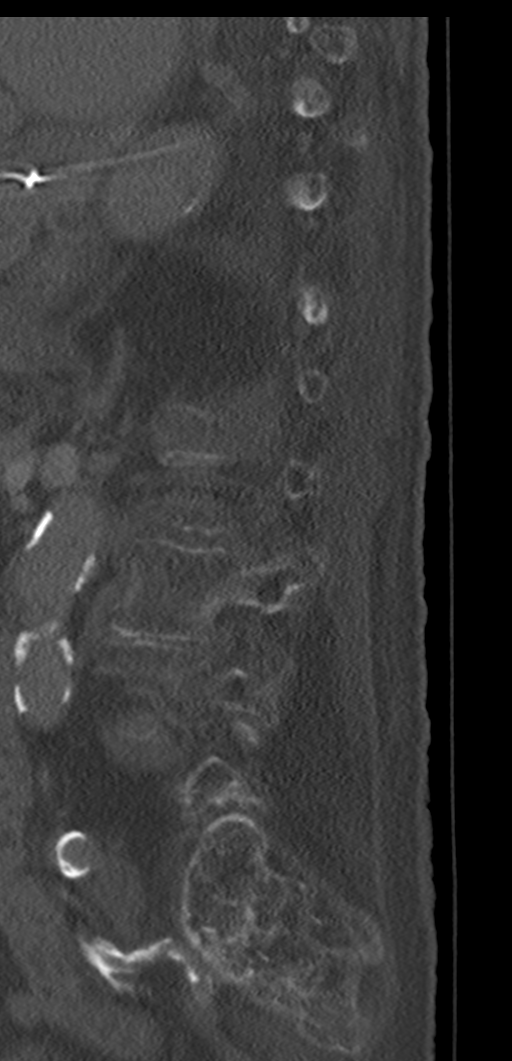
[im 27/41  bone]
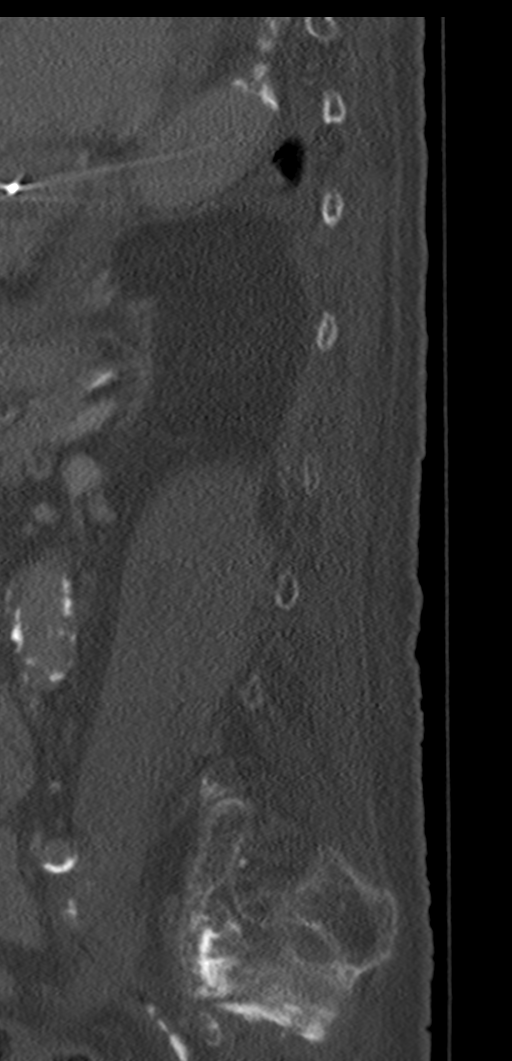

[10 of 33 positions shown; findings below may reference images not displayed]

FINDINGS: CT THORACIC SPINE FINDINGS

Alignment: Moderate levocurvature of upper thoracic spine. Normal
thoracic kyphosis without listhesis.

Vertebrae: Stable mild T6 and T7 compression deformities. Stable
mild T11 and moderate to severe T12 compression deformity.

Paraspinal and other soft tissues: Please refer to the concurrent CT
of the chest.

Disc levels: Mild multilevel disc and facet degenerative changes. No
significant bony foraminal or canal stenosis.

CT LUMBAR SPINE FINDINGS

Segmentation: Mild lumbar levocurvature with apex at L3.

Alignment: Normal lumbar lordosis without listhesis.

Vertebrae: No acute fracture or focal pathologic process.

Paraspinal and other soft tissues: Please refer to concurrent CT of
the abdomen and pelvis.

Disc levels: Mild L1-2 and severe L2-L4 loss of disc space height.
Multilevel facet hypertrophy. No high-grade bony canal stenosis.
IMPRESSION: CT THORACIC SPINE IMPRESSION

1. Stable chronic T6, T7, T11, and T12 compression deformities.
2. No acute fracture identified.

CT LUMBAR SPINE IMPRESSION

No acute fracture identified.

By: Nya Jumper M.D.

## 2018-08-18 IMAGING — CT CT T SPINE W/O CM
3 of 4 series · 12 of 33 positions shown, 14 images · IV contrast (Isovue)
Comparison: 04/17/2016 CT of the chest. Concurrent CT of chest,
abdomen, and pelvis.

CLINICAL DATA: 89 y/o  F; fall.  Evaluation of back pain.

EXAM:
CT THORACIC AND LUMBAR SPINE WITHOUT CONTRAST
TECHNIQUE: Multidetector CT imaging of the thoracic and lumbar spine was
performed without contrast. Multiplanar CT image reconstructions
were also generated.

[Series 4: axial tspine st · axial · 0.31mm/px · z∈[-190,-55]mm · 4 of 77 slices shown, 5 images]
[im 16/77  soft-tissue]
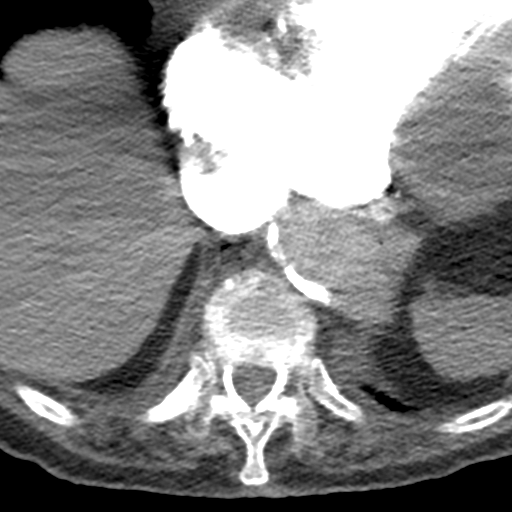
[im 16/77  bone]
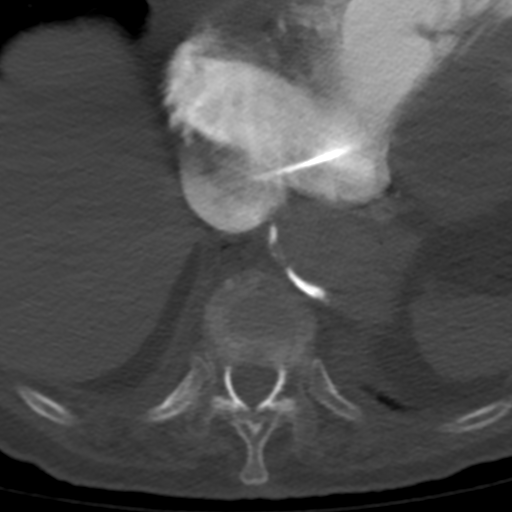
[im 31/77  bone]
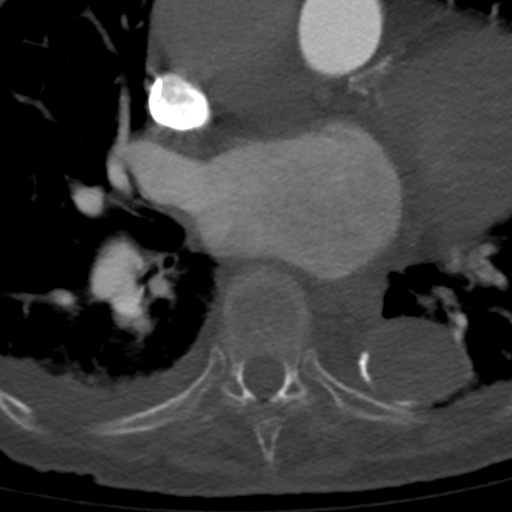
[im 46/77  bone]
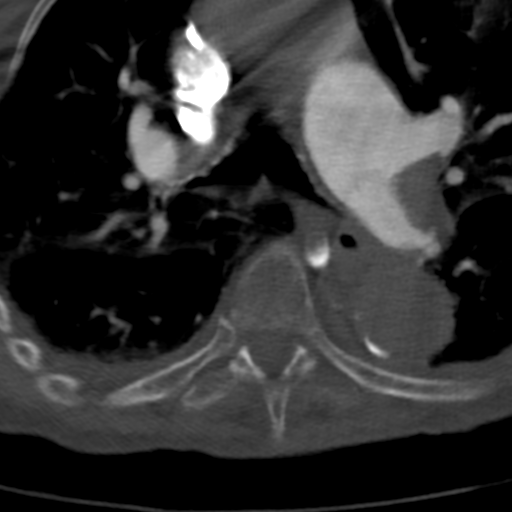
[im 61/77  bone]
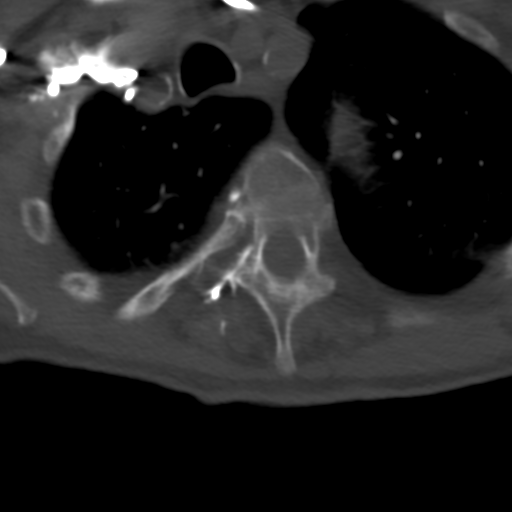

[Series 6: coronal tspine · coronal · 0.35mm/px · 3 of 76 slices shown]
[im 16/76  bone]
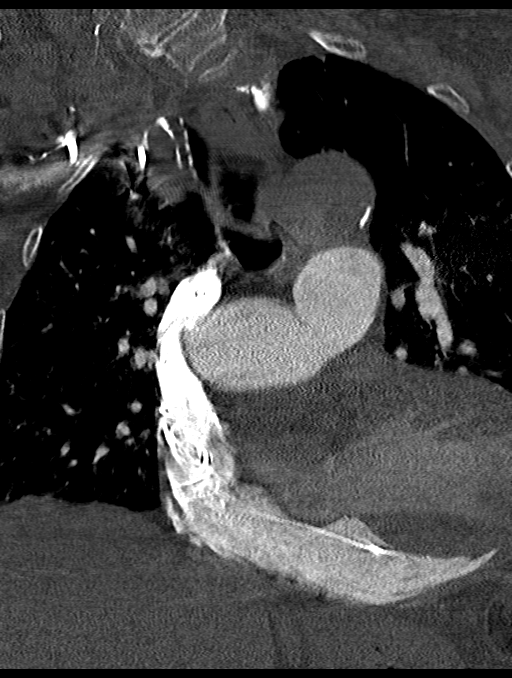
[im 31/76  bone]
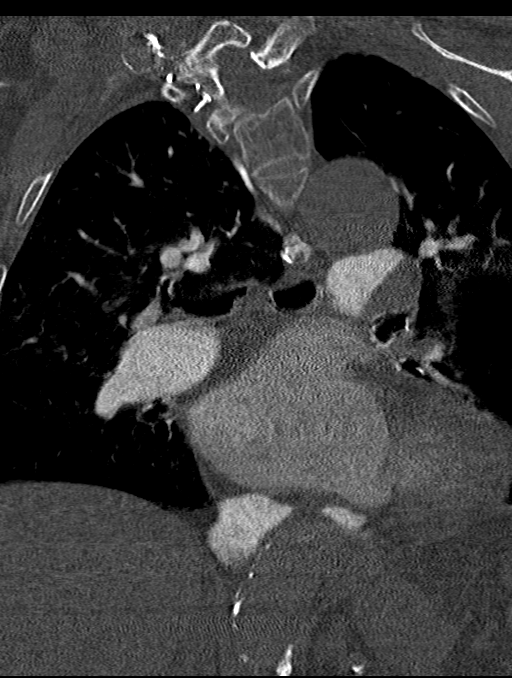
[im 46/76  bone]
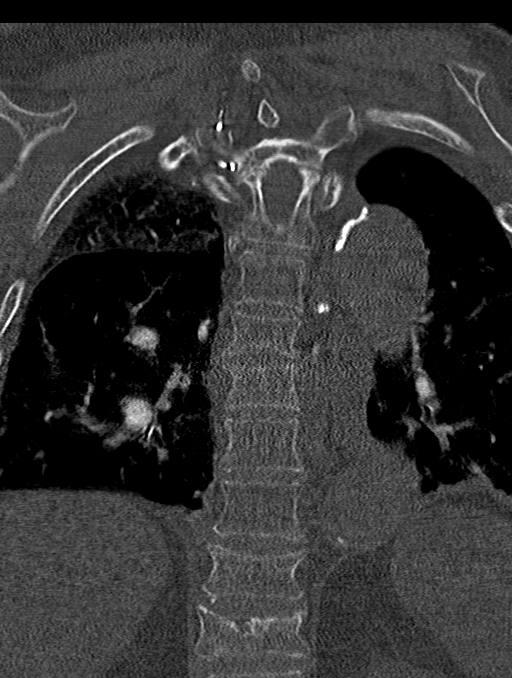

[Series 7: sagittal tspine · sagittal · 0.30mm/px · 5 of 54 slices shown, 6 images]
[im 18/54  bone]
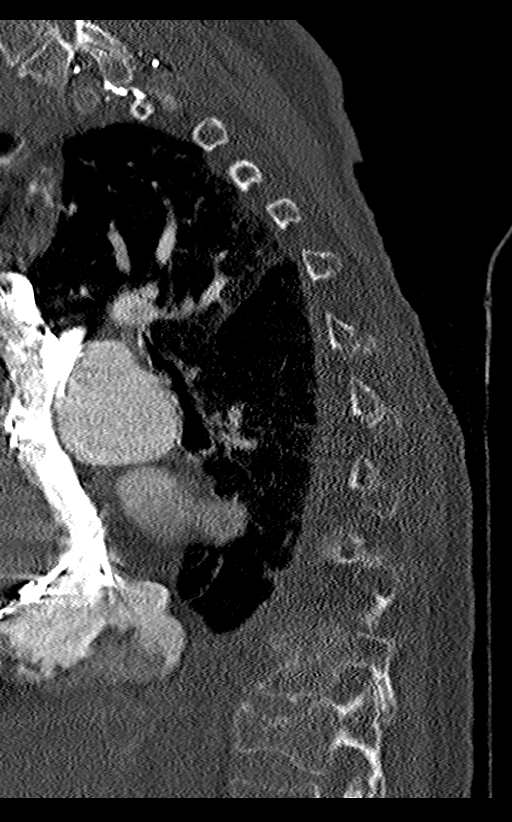
[im 23/54  bone]
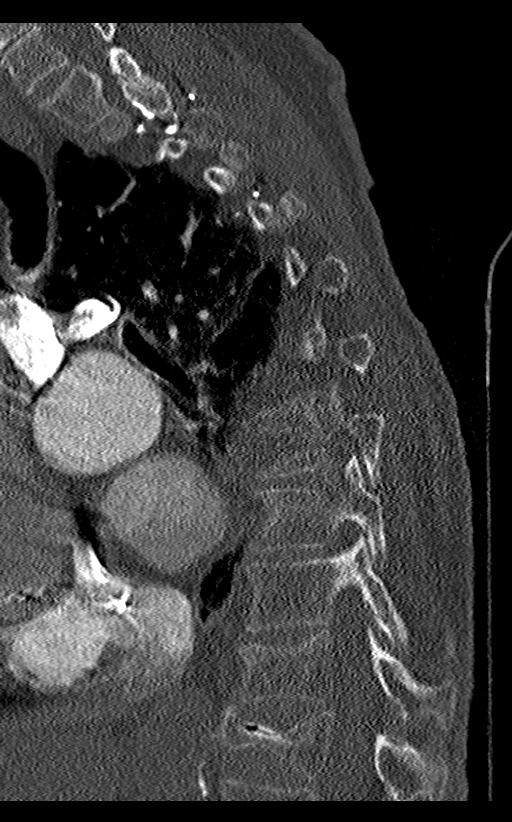
[im 27/54  soft-tissue]
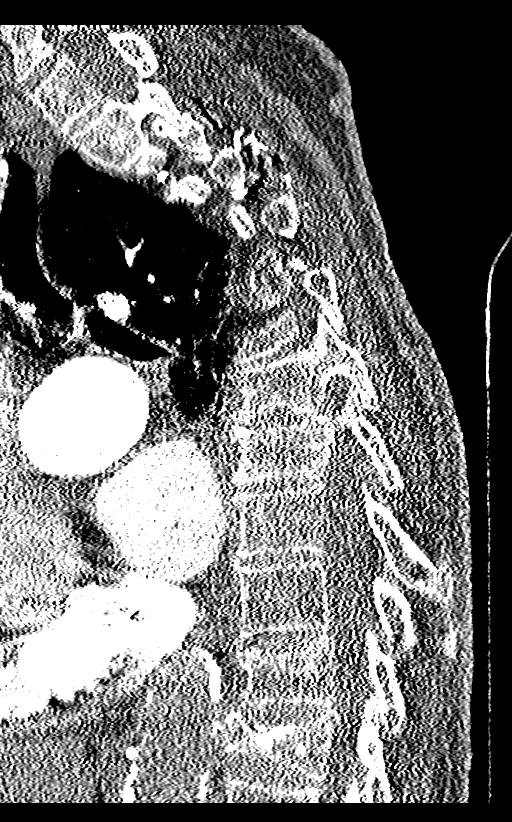
[im 27/54  bone]
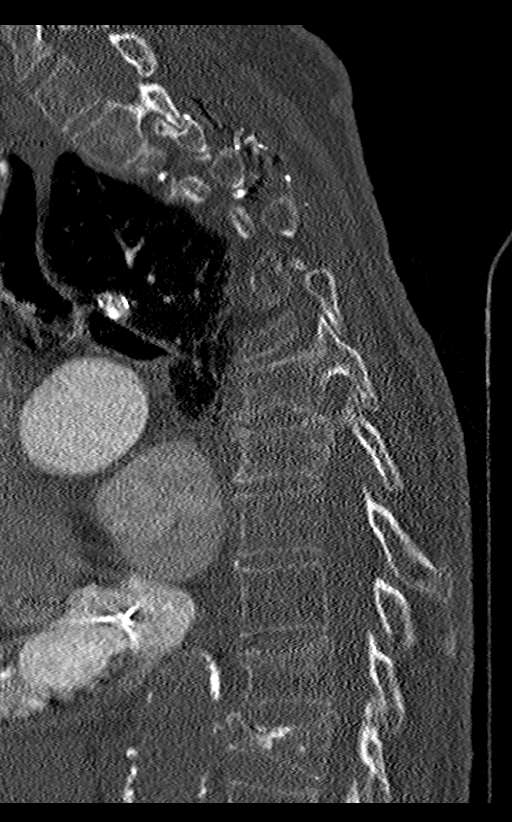
[im 31/54  bone]
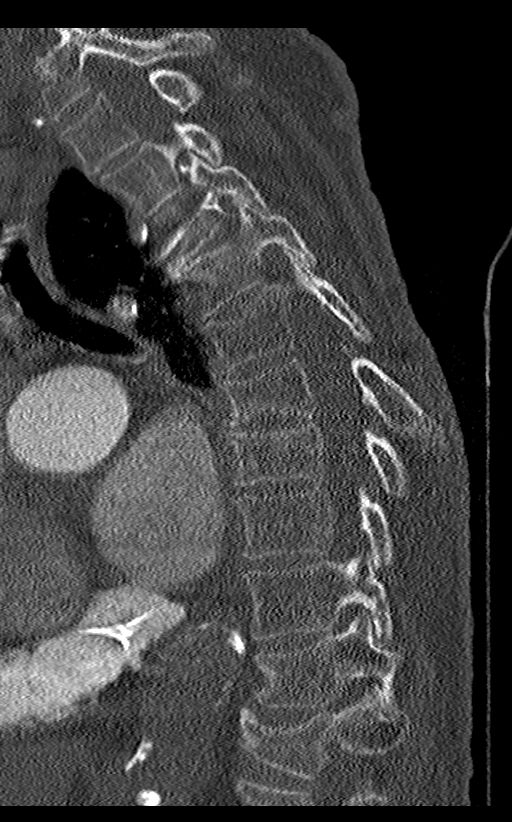
[im 36/54  bone]
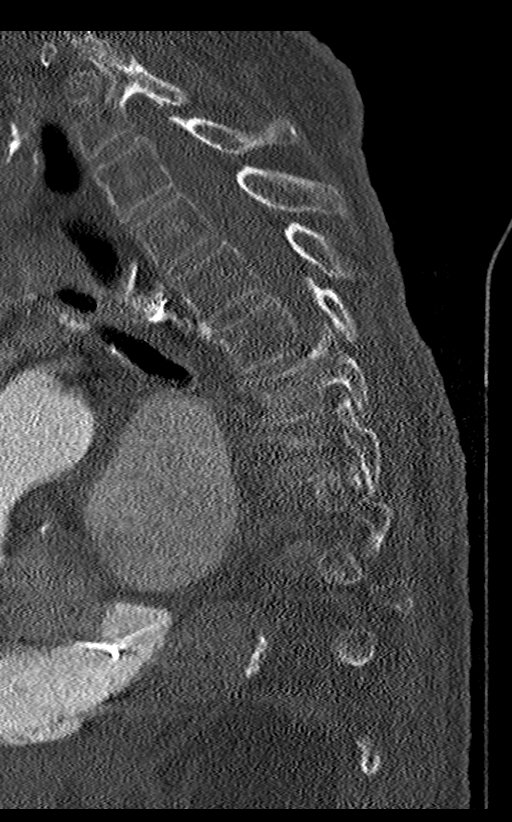

[12 of 33 positions shown; findings below may reference images not displayed]

FINDINGS: CT THORACIC SPINE FINDINGS

Alignment: Moderate levocurvature of upper thoracic spine. Normal
thoracic kyphosis without listhesis.

Vertebrae: Stable mild T6 and T7 compression deformities. Stable
mild T11 and moderate to severe T12 compression deformity.

Paraspinal and other soft tissues: Please refer to the concurrent CT
of the chest.

Disc levels: Mild multilevel disc and facet degenerative changes. No
significant bony foraminal or canal stenosis.

CT LUMBAR SPINE FINDINGS

Segmentation: Mild lumbar levocurvature with apex at L3.

Alignment: Normal lumbar lordosis without listhesis.

Vertebrae: No acute fracture or focal pathologic process.

Paraspinal and other soft tissues: Please refer to concurrent CT of
the abdomen and pelvis.

Disc levels: Mild L1-2 and severe L2-L4 loss of disc space height.
Multilevel facet hypertrophy. No high-grade bony canal stenosis.
IMPRESSION: CT THORACIC SPINE IMPRESSION

1. Stable chronic T6, T7, T11, and T12 compression deformities.
2. No acute fracture identified.

CT LUMBAR SPINE IMPRESSION

No acute fracture identified.

By: Nya Jumper M.D.

## 2018-08-18 IMAGING — CT CT ANGIO CHEST
2 of 6 series · 17 of 46 positions shown · IV contrast (iopamidol)
Comparison: 04/17/2016

CLINICAL DATA: fall last night. Staff reported pt complained of
back pain but denies pain now. Husband reports 4 falls in 6 days and
has been weak

EXAM:
CT ANGIOGRAPHY CHEST WITH CONTRAST
TECHNIQUE: Multidetector CT imaging of the chest was performed using the
standard protocol during bolus administration of intravenous
contrast. Multiplanar CT image reconstructions and MIPs were
obtained to evaluate the vascular anatomy.
CONTRAST:  80mL [id number] IOPAMIDOL ([id number]) INJECTION 76%

[Series 3: thins · axial · 0.59mm/px · z∈[-226,-17]mm · 14 of 229 slices shown]
[im 10/229  lung]
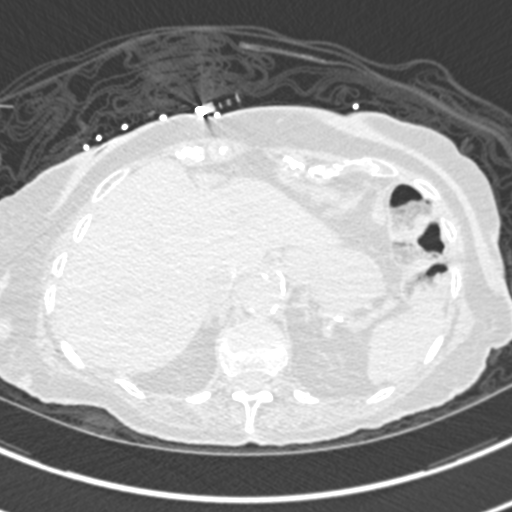
[im 30/229  soft-tissue]
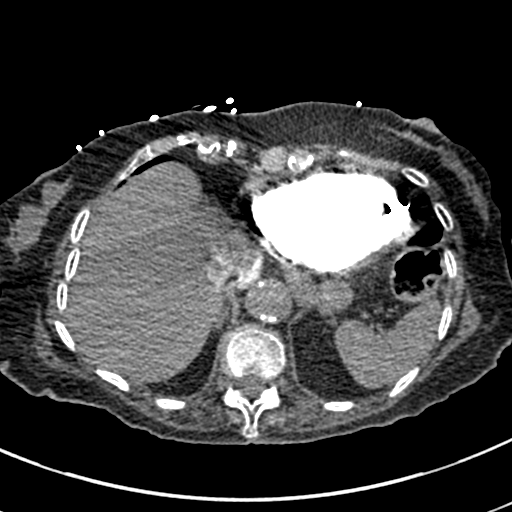
[im 40/229  lung]
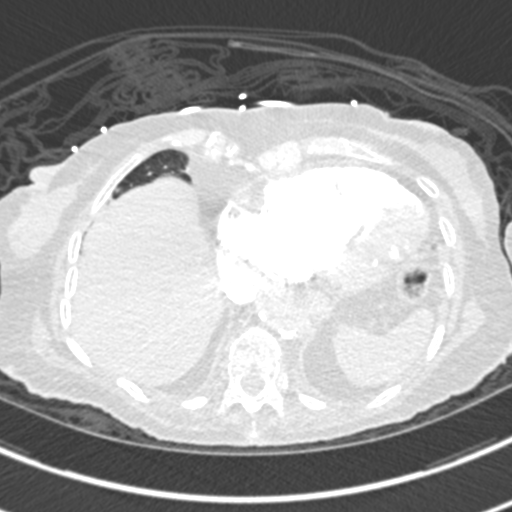
[im 60/229  soft-tissue]
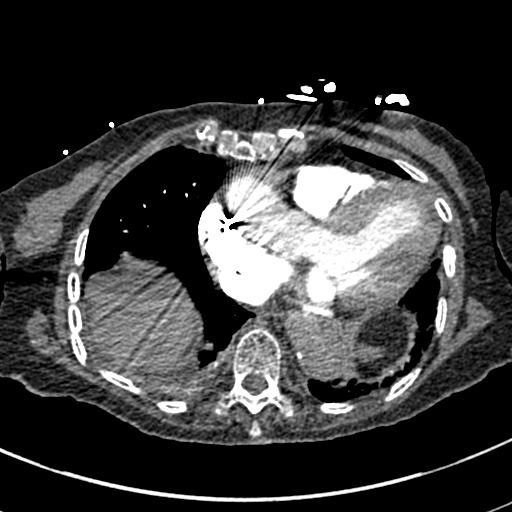
[im 80/229  lung]
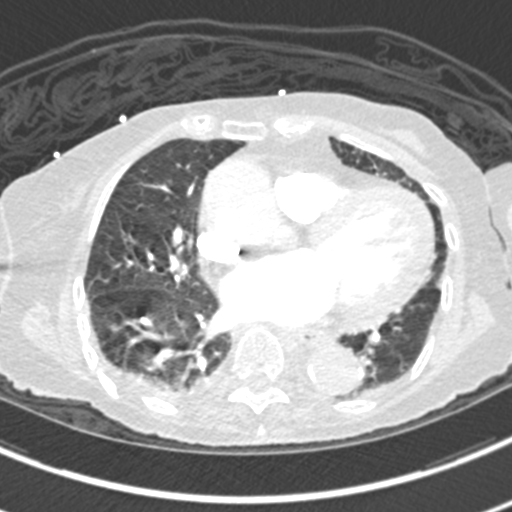
[im 90/229  soft-tissue]
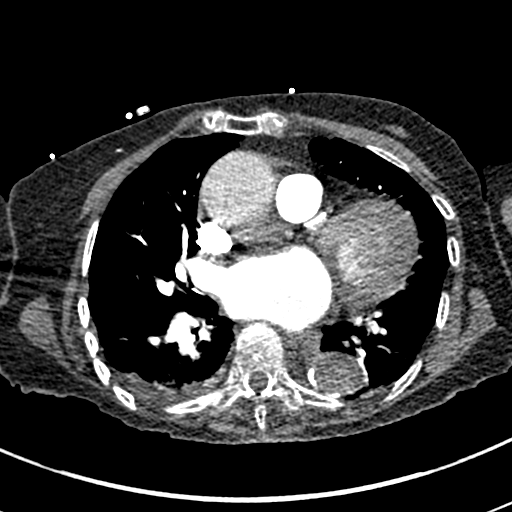
[im 110/229  lung]
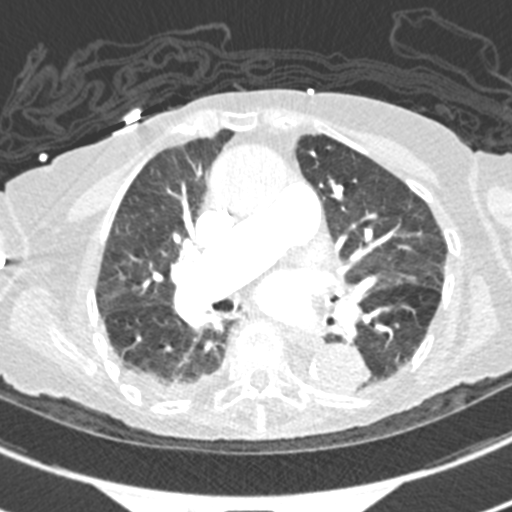
[im 119/229  soft-tissue]
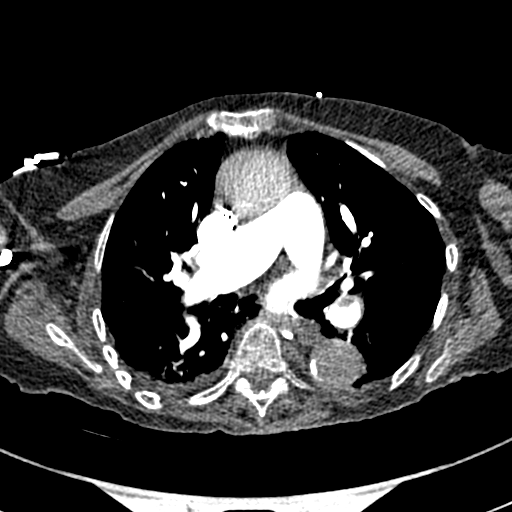
[im 139/229  lung]
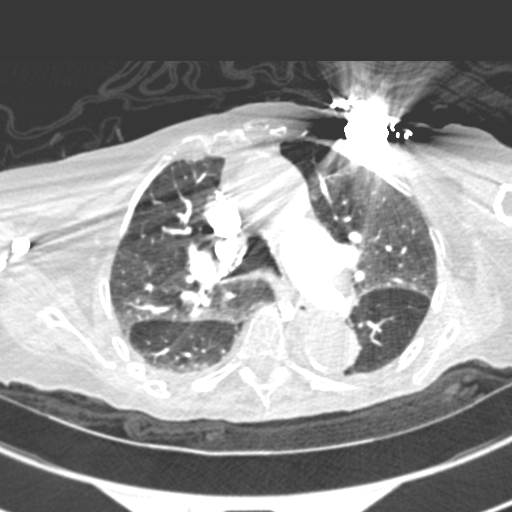
[im 149/229  soft-tissue]
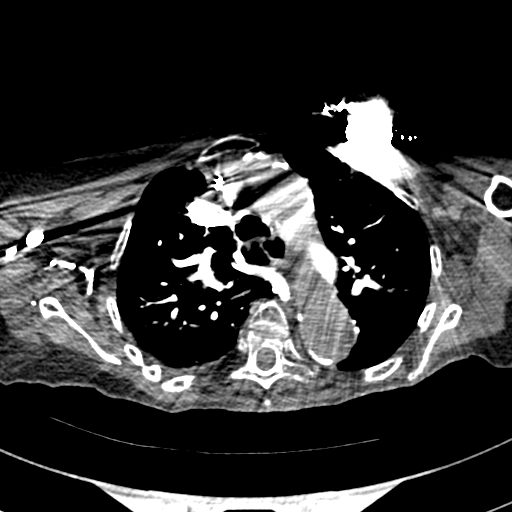
[im 169/229  lung]
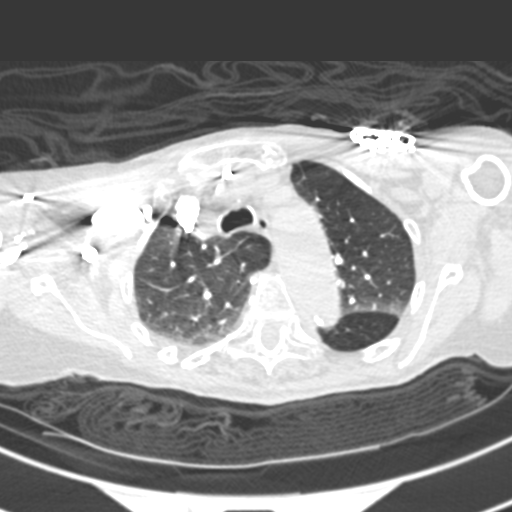
[im 189/229  soft-tissue]
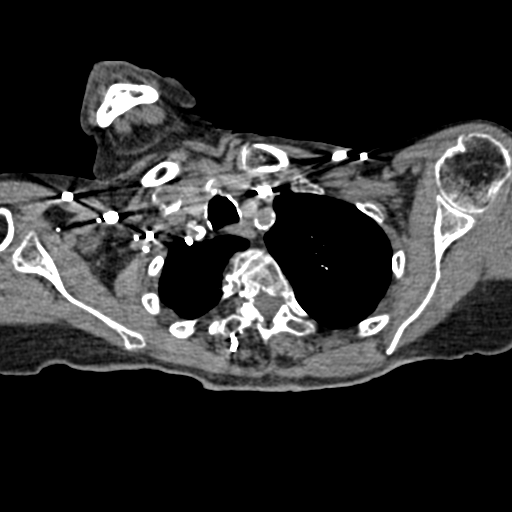
[im 199/229  lung]
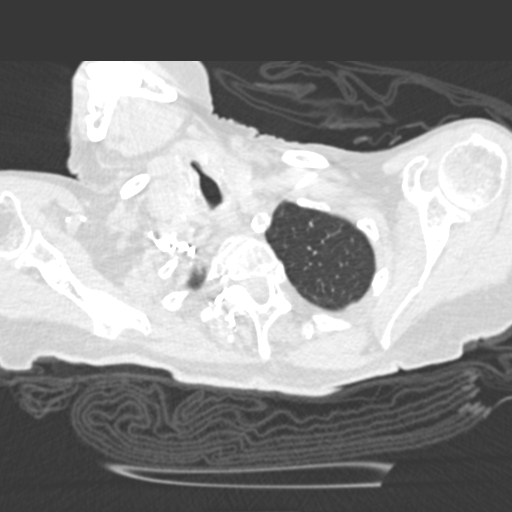
[im 219/229  soft-tissue]
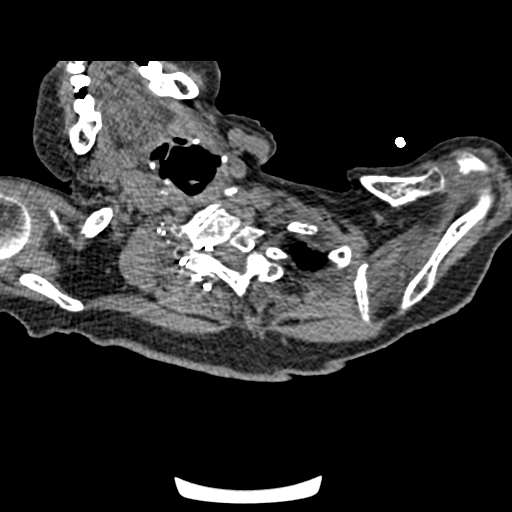

[Series 5: coronal mpr · coronal · 0.46mm/px · 3 of 103 slices shown]
[im 26/103  soft-tissue]
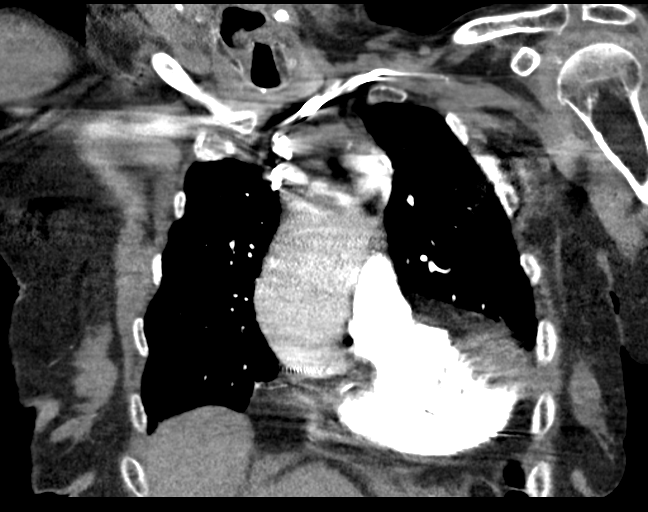
[im 52/103  soft-tissue]
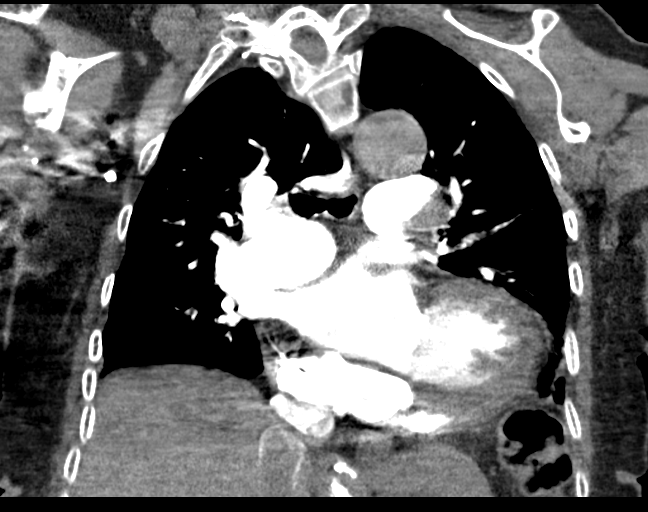
[im 77/103  soft-tissue]
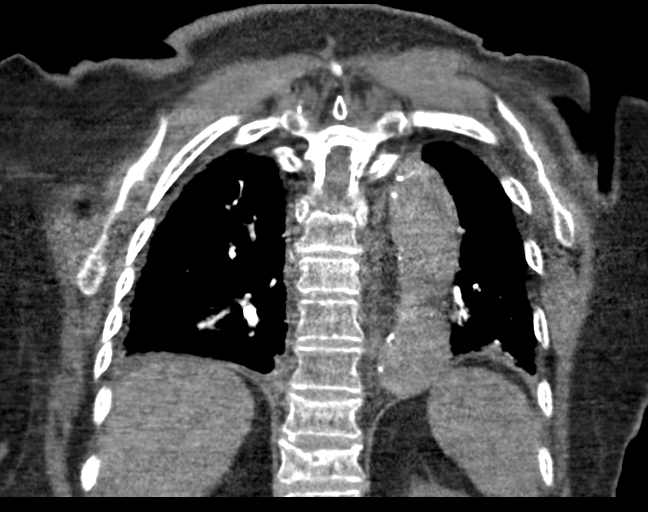

[17 of 46 positions shown; findings below may reference images not displayed]

FINDINGS: Cardiovascular: Moderate cardiomegaly. No pericardial effusion.
Right ventricular dilatation. RV/LV ratio 1.5. Dilated central
pulmonary arteries, present since 01/31/2015. There is a partially
large central filling defect at the bifurcation of the left
pulmonary artery. Partially occlusive embolus in left lower lobe
branch. Possible nonocclusive thrombus in a posterior right lower
lobe branch although motion degrades images through the lung bases.

Dilatation of the ascending aorta up to 4.3 cm diameter. There is
incomplete opacification of the arch and descending thoracic aorta,
limiting assessment for aortic dissection.. Scattered coarse
calcified plaque.

Mediastinum/Nodes: No hilar or mediastinal adenopathy.

Lungs/Pleura: Trace right pleural effusion. No pneumothorax. Minimal
dependent atelectasis posteriorly in both lung bases. Lungs
otherwise clear.

Upper Abdomen: No acute findings

Musculoskeletal: Interval T12 vertebral body cement augmentation.
Stable compression deformities of T6, T7 and T11. Old healed sternal
fracture. No acute fracture.

Review of the MIP images confirms the above findings.
IMPRESSION: 1. Positive for bilateral acute pulmonary emboli, left greater than
right. CT evidence of right heart strain (RV/LV Ratio = 1.5)
suggesting at least submassive (intermediate risk) PE. The presence
of right heart strain has been associated with an increased risk of
morbidity and mortality. Please activate Code PE by paging
[phone].
Critical Value/emergent results were called by telephone at the time
of interpretation on 06/11/2017 at [DATE] to Dr. QUIRIJN AMAZIGH , who
verbally acknowledged these results.
[DATE] cm ascending aortic aneurysm. Recommend annual imaging
followup by CTA or MRA. This recommendation follows 2545
ACCF/AHA/AATS/ACR/ASA/SCA/HEOK/NURETIN/TASHIRO/AKE Guidelines for the
Diagnosis and Management of Patients with Thoracic Aortic Disease.
Circulation. 2545; 121: [id number]
